# Patient Record
Sex: Female | Born: 1970
Health system: Southern US, Community
[De-identification: ages and names within clinical notes are randomized; demographics above are authoritative.]

## PROBLEM LIST (undated history)

## (undated) DIAGNOSIS — N979 Female infertility, unspecified: Secondary | ICD-10-CM

## (undated) DIAGNOSIS — G479 Sleep disorder, unspecified: Secondary | ICD-10-CM

## (undated) DIAGNOSIS — F419 Anxiety disorder, unspecified: Secondary | ICD-10-CM

## (undated) DIAGNOSIS — G43109 Migraine with aura, not intractable, without status migrainosus: Secondary | ICD-10-CM

## (undated) DIAGNOSIS — E039 Hypothyroidism, unspecified: Secondary | ICD-10-CM

## (undated) DIAGNOSIS — M5430 Sciatica, unspecified side: Secondary | ICD-10-CM

## (undated) DIAGNOSIS — E785 Hyperlipidemia, unspecified: Secondary | ICD-10-CM

## (undated) DIAGNOSIS — F32A Depression, unspecified: Secondary | ICD-10-CM

## (undated) DIAGNOSIS — F329 Major depressive disorder, single episode, unspecified: Secondary | ICD-10-CM

## (undated) DIAGNOSIS — E079 Disorder of thyroid, unspecified: Secondary | ICD-10-CM

## (undated) HISTORY — DX: Disorder of thyroid, unspecified: E07.9

## (undated) HISTORY — DX: Major depressive disorder, single episode, unspecified: F32.9

## (undated) HISTORY — PX: WISDOM TOOTH EXTRACTION: SHX21

## (undated) HISTORY — DX: Female infertility, unspecified: N97.9

## (undated) HISTORY — DX: Anxiety disorder, unspecified: F41.9

## (undated) HISTORY — DX: Depression, unspecified: F32.A

## (undated) HISTORY — DX: Sleep disorder, unspecified: G47.9

## (undated) HISTORY — DX: Sciatica, unspecified side: M54.30

## (undated) HISTORY — DX: Hyperlipidemia, unspecified: E78.5

## (undated) HISTORY — DX: Migraine with aura, not intractable, without status migrainosus: G43.109

---

## 2007-09-12 HISTORY — PX: OTHER SURGICAL HISTORY: SHX169

## 2014-02-23 ENCOUNTER — Telehealth: Payer: Self-pay | Admitting: Obstetrics and Gynecology

## 2014-02-23 NOTE — Telephone Encounter (Signed)
Call to confirm pts appt no answer unable to leave vm  Says it has not been set up yet

## 2014-02-27 NOTE — Telephone Encounter (Signed)
Man answered said wrong #

## 2014-03-01 ENCOUNTER — Encounter: Payer: Self-pay | Admitting: Obstetrics and Gynecology

## 2014-03-01 ENCOUNTER — Ambulatory Visit (INDEPENDENT_AMBULATORY_CARE_PROVIDER_SITE_OTHER): Payer: 59 | Admitting: Obstetrics and Gynecology

## 2014-03-01 VITALS — BP 100/74 | HR 84 | Resp 18 | Ht 64.75 in | Wt 185.6 lb

## 2014-03-01 DIAGNOSIS — Z01419 Encounter for gynecological examination (general) (routine) without abnormal findings: Secondary | ICD-10-CM

## 2014-03-01 DIAGNOSIS — Z0000 Encounter for general adult medical examination without abnormal findings: Secondary | ICD-10-CM

## 2014-03-01 DIAGNOSIS — R3129 Other microscopic hematuria: Secondary | ICD-10-CM

## 2014-03-01 LAB — POCT URINALYSIS DIPSTICK
BILIRUBIN UA: NEGATIVE
Glucose, UA: NEGATIVE
KETONES UA: NEGATIVE
LEUKOCYTES UA: NEGATIVE
Nitrite, UA: NEGATIVE
PH UA: 5
PROTEIN UA: NEGATIVE
Urobilinogen, UA: NEGATIVE

## 2014-03-01 LAB — HEMOGLOBIN, FINGERSTICK: Hemoglobin, fingerstick: 12.7 g/dL (ref 12.0–16.0)

## 2014-03-01 MED ORDER — LEVONORGEST-ETH ESTRAD 91-DAY 0.1-0.02 & 0.01 MG PO TABS: 1.0000 | ORAL_TABLET | Freq: Every day | ORAL | Status: DC

## 2014-03-01 NOTE — Progress Notes (Signed)
Patient scheduled for screening Mammogram at [facility] for 03/14/14 at 1030. Patient agreeable to time/date/location.

## 2014-03-01 NOTE — Patient Instructions (Signed)

## 2014-03-01 NOTE — Progress Notes (Signed)
Patient ID: Rachel Wiggins, female   DOB: [birth year], 43 y.o.   MRN: [id number] GYNECOLOGY VISIT  PCP:  None  Referring provider:   HPI: 43 y.o.   Married  Caucasian  female   G2P2002 with Patient's last menstrual period was 02/13/2014.   here for  AEX.   History of headaches.  Saw a neurologist in [place].  Having menstrual headaches prior to menses.  Has tension headaches.   Has been taking combined OCPs but ran out.  Has not taken for 2 weeks.  Had lower pelvic pain last hs.  Took some pain medication from [place].    Takes birth control for control of menses. Menses are prolonged and heavy.  States it is not for prevention of childbearing, which is against her religion.   Takes Prozac for depression - mild.  Stress under control with this medication.   Did general labs with Dr. Ruben Gottron about 4 months ago.  Need a new PCP.   Hgb:  12.7 Urine:  Trace RBC's - sometimes has urinary discomfort.   History of UTIs 20 years ago.   GYNECOLOGIC HISTORY: Patient's last menstrual period was 02/13/2014. Sexually active:  yes Partner preference: female Contraception:  OCP's-Sprintec (has been out for 22mo.)  Menopausal hormone therapy: n/a DES exposure: no   Blood transfusions:   no Sexually transmitted diseases:  no  GYN procedures and prior surgeries:  no Last mammogram: 7 years ago wnl:in [place]                Last pap and high risk HPV testing: 11/2012 normal--unsure of HPV testing   History of abnormal pap smear:  no   OB History   Grav Para Term Preterm Abortions TAB SAB Ect Mult Living   2 2 2       2        LIFESTYLE: Exercise:   no             OTHER HEALTH MAINTENANCE: Tetanus/TDap:  2010 HPV:                 n/a Influenza:          2013   Bone density:   n/a Colonoscopy:  n/a  Cholesterol check: normal 2-3 years ago.  Family History  Problem Relation Age of Onset  . Diabetes Mother   . Hypertension Mother   . Thyroid disease Mother     hx thyroid nodule   . Hypertension Father   . Hyperlipidemia Father   . Diabetes Maternal Grandmother   . Diabetes Maternal Grandfather   . Diabetes Paternal Grandmother   . Diabetes Paternal Grandfather     There are no active problems to display for this patient.  Past Medical History  Diagnosis Date  . Thyroid disease   . Anxiety   . Depression   . Infertility, female     Past Surgical History  Procedure Laterality Date  . Hemorrhectomy  09/2007    --in [place]    ALLERGIES: Review of patient's allergies indicates no known allergies.  Current Outpatient Prescriptions  Medication Sig Dispense Refill  . FLUoxetine (PROZAC) 20 MG capsule Take 20 mg by mouth daily.      Marland Kitchen levothyroxine (SYNTHROID, LEVOTHROID) 75 MCG tablet Take 75 mcg by mouth daily before breakfast.       No current facility-administered medications for this visit.     ROS:  Pertinent items are noted in HPI.  History   Social History  . Marital Status:  Married    Spouse Name: N/A    Number of Children: N/A  . Years of Education: N/A   Occupational History  . Not on file.   Social History Main Topics  . Smoking status: Never Smoker   . Smokeless tobacco: Not on file  . Alcohol Use: No  . Drug Use: No  . Sexual Activity: Yes    Partners: Male    Birth Control/ Protection: OCP     Comment: Sprintec   Other Topics Concern  . Not on file   Social History Narrative  . No narrative on file    PHYSICAL EXAMINATION:    BP 100/74  Pulse 84  Resp 18  Ht 5' 4.75" (1.645 m)  Wt 185 lb 9.6 oz (84.188 kg)  BMI 31.11 kg/m2  LMP 02/13/2014   Wt Readings from Last 3 Encounters:  03/01/14 185 lb 9.6 oz (84.188 kg)     Ht Readings from Last 3 Encounters:  03/01/14 5' 4.75" (1.645 m)    General appearance: alert, cooperative and appears stated age Head: Normocephalic, without obvious abnormality, atraumatic Neck: no adenopathy, supple, symmetrical, trachea midline and thyroid not enlarged, symmetric, no  tenderness/mass/nodules Lungs: clear to auscultation bilaterally Breasts: Inspection negative, No nipple retraction or dimpling, No nipple discharge or bleeding, No axillary or supraclavicular adenopathy, Normal to palpation without dominant masses Heart: regular rate and rhythm Abdomen: soft, non-tender; no masses,  no organomegaly Extremities: extremities normal, atraumatic, no cyanosis or edema Skin: Skin color, texture, turgor normal. No rashes or lesions Lymph nodes: Cervical, supraclavicular, and axillary nodes normal. No abnormal inguinal nodes palpated Neurologic: Grossly normal  Pelvic: External genitalia:  no lesions              Urethra:  normal appearing urethra with no masses, tenderness or lesions              Bartholins and Skenes: normal                 Vagina: normal appearing vagina with normal color and discharge, no lesions              Cervix: normal appearance.  Small amount of blood tinged mucous at os.               Pap and high risk HPV testing done: Yes.          Bimanual Exam:  Uterus:  uterus is normal size, shape, consistency and tender                                      Adnexa: normal adnexa in size, nontender and no masses                                      Rectovaginal:  Yes.                                        Confirms above.                                      Anus:  normal sphincter tone, no lesions  ASSESSMENT  Normal gynecologic  exam. Menstrual headaches.  History of menorrhagia and ovulatory pain.  I suspect ovulation pain now.  Microscopic hematuria.   PLAN  Mammogram recommended yearly starting at age 32.  [facility] will [facility].  Pap smear and high risk HPV testing as above. Counseled on self breast exam. See lab orders: Yes.   UPT now:  Negative. Rx for Lo-Seasonique.   See EPIC orders.  Instructed in use.  Begin with next menses.  Patient will establish care with new PCP.  Return annually or prn   An After  Visit Summary was printed and given to the patient.

## 2014-03-02 LAB — URINALYSIS, MICROSCOPIC ONLY
BACTERIA UA: NONE SEEN
CASTS: NONE SEEN
CRYSTALS: NONE SEEN

## 2014-03-02 LAB — URINE CULTURE
COLONY COUNT: NO GROWTH
Organism ID, Bacteria: NO GROWTH

## 2014-03-06 LAB — IPS PAP TEST WITH HPV

## 2014-03-14 ENCOUNTER — Other Ambulatory Visit: Payer: Self-pay | Admitting: Obstetrics and Gynecology

## 2014-03-14 ENCOUNTER — Ambulatory Visit: Payer: Self-pay

## 2014-03-14 ENCOUNTER — Ambulatory Visit
Admission: RE | Admit: 2014-03-14 | Discharge: 2014-03-14 | Disposition: A | Discharge status: Home / Self Care | Payer: 59 | Source: Ambulatory Visit | Attending: Obstetrics and Gynecology | Admitting: Obstetrics and Gynecology

## 2014-03-14 DIAGNOSIS — Z1231 Encounter for screening mammogram for malignant neoplasm of breast: Secondary | ICD-10-CM

## 2014-03-16 ENCOUNTER — Telehealth: Payer: Self-pay | Admitting: Obstetrics and Gynecology

## 2014-03-16 ENCOUNTER — Other Ambulatory Visit: Payer: Self-pay | Admitting: Obstetrics and Gynecology

## 2014-03-16 DIAGNOSIS — R928 Other abnormal and inconclusive findings on diagnostic imaging of breast: Secondary | ICD-10-CM

## 2014-03-16 NOTE — Telephone Encounter (Signed)
Tried to reach patient at number provided [phone]. Recording states that call has been forwarded to a voicemail service that has not been set up yet. Unable to leave message.

## 2014-03-16 NOTE — Telephone Encounter (Signed)
Pt says her Rx for birth control that should have been sent over is not at the pharmacy.

## 2014-03-16 NOTE — Telephone Encounter (Signed)
Tried to reach patient at number provided [phone]. There was no answer and the voicemail box has not been set up yet. Need to verify pharmacy patient's rx was sent to.

## 2014-03-17 NOTE — Telephone Encounter (Signed)
Tried to reach patient at number provided [phone]. Recording states that call has been forwarded to a voicemail service that has not been set up yet. Unable to leave a message to verify pharmacy.

## 2014-03-21 NOTE — Telephone Encounter (Signed)
OK to close the encounter.  I will do so now.

## 2014-03-21 NOTE — Telephone Encounter (Signed)
Dr. Quincy Simmonds, unable to reach patient x 3. Okay to close encounter?

## 2014-03-28 ENCOUNTER — Telehealth: Payer: Self-pay | Admitting: Obstetrics and Gynecology

## 2014-03-28 MED ORDER — LEVONORGEST-ETH ESTRAD 91-DAY 0.1-0.02 & 0.01 MG PO TABS: 1.0000 | ORAL_TABLET | Freq: Every day | ORAL | Status: DC

## 2014-03-28 NOTE — Telephone Encounter (Signed)
Pt says she called about medication being sent to pharmacy. Pt's area code was wrong in system so was not able to reach her at that time. Phone number has been corrected.

## 2014-03-28 NOTE — Telephone Encounter (Signed)
Spoke with patient who states that when she went to the pharmacy to pick up her birth control they told her they did not have a prescription for her. Advised patient will resend prescription to pharmacy of choice at this time. Advised to call back if any further problems. Patient agreeable. Patient would like to know when she should start her birth control. Advised to start on the first day of her next cycle. Patient agreeable.  Routing to provider for final review. Patient agreeable to disposition. Will close encounter

## 2014-04-06 ENCOUNTER — Ambulatory Visit
Admission: RE | Admit: 2014-04-06 | Discharge: 2014-04-06 | Disposition: A | Discharge status: Home / Self Care | Payer: 59 | Source: Ambulatory Visit | Attending: Obstetrics and Gynecology | Admitting: Obstetrics and Gynecology

## 2014-04-06 DIAGNOSIS — R928 Other abnormal and inconclusive findings on diagnostic imaging of breast: Secondary | ICD-10-CM

## 2014-05-05 ENCOUNTER — Telehealth: Payer: Self-pay | Admitting: Obstetrics and Gynecology

## 2014-05-05 NOTE — Telephone Encounter (Signed)
Called and spoke with patient.  Advised can call [facility]-Dr. Ardeth Perfect is accepting new patients, Dr. Rachell Cipro and [facility] care. Gave phone numbers for all.  Advised to call back if still needs assistance.   Advised patient will need office visit if would like referrals to rheumatology and orthopedics. Patient states she will call back.   Routing to provider for final review. Patient agreeable to disposition. Will close encounter

## 2014-05-05 NOTE — Telephone Encounter (Signed)
Pt came in to office states dr Quincy Simmonds gave her names of two different PCP offices but neither were accepting new pts. Pt would like to know who else she would recommend. Pt also wants to know who's she would recommend for rheumatologist and orthopaedist. Pt would like referral for this doctors to be able to get in soon.

## 2014-05-15 ENCOUNTER — Encounter: Payer: Self-pay | Admitting: Obstetrics and Gynecology

## 2014-10-24 ENCOUNTER — Telehealth: Payer: Self-pay | Admitting: Obstetrics and Gynecology

## 2014-10-24 NOTE — Telephone Encounter (Signed)
Pt says her pcp changed all her medications. She is not happy about this and would like to speak with Dr Quincy Simmonds. Left a copy of her med sheet for nurse.

## 2014-10-24 NOTE — Telephone Encounter (Signed)
Spoke with patient. She has concerns about changes to medications done by PCP Dr. Ernie Hew.  Patient states that prozac was increased from 20 mg to 40 mg and was causing her headaches. Then after increase of headaches, rx changed to Lexapro 20 mg.  Patient still continues with headaches and this is not out of ordinary for her. Also, patient was changed from [place] to [place]. Patient feels these changes should be discussed with Dr. Quincy Simmonds. Consult appointment scheduled for 10/27/14 with Dr. Quincy Simmonds and patient agreeable. Routing to provider for final review. Patient agreeable to disposition. Will close encounter

## 2014-10-27 ENCOUNTER — Ambulatory Visit (INDEPENDENT_AMBULATORY_CARE_PROVIDER_SITE_OTHER): Payer: 59 | Admitting: Obstetrics and Gynecology

## 2014-10-27 ENCOUNTER — Encounter: Payer: Self-pay | Admitting: Obstetrics and Gynecology

## 2014-10-27 VITALS — BP 110/70 | HR 80 | Ht 64.75 in | Wt 191.2 lb

## 2014-10-27 DIAGNOSIS — Z8742 Personal history of other diseases of the female genital tract: Secondary | ICD-10-CM | POA: Diagnosis not present

## 2014-10-27 DIAGNOSIS — Z3041 Encounter for surveillance of contraceptive pills: Secondary | ICD-10-CM | POA: Diagnosis not present

## 2014-10-27 DIAGNOSIS — R51 Headache: Secondary | ICD-10-CM

## 2014-10-27 DIAGNOSIS — R519 Headache, unspecified: Secondary | ICD-10-CM

## 2014-10-27 MED ORDER — FLUOXETINE HCL 20 MG PO CAPS: 20.0000 mg | ORAL_CAPSULE | Freq: Every day | ORAL | Status: DC

## 2014-10-27 MED ORDER — NORETHINDRONE 0.35 MG PO TABS: 1.0000 | ORAL_TABLET | Freq: Every day | ORAL | Status: DC

## 2014-10-27 MED ORDER — LEVOTHYROXINE SODIUM 75 MCG PO TABS: 75.0000 ug | ORAL_TABLET | Freq: Every day | ORAL | Status: DC

## 2014-10-27 NOTE — Progress Notes (Signed)
Patient ID: Rachel Wiggins, female   DOB: August 22, 1970, 44 y.o.   MRN: [id number] GYNECOLOGY  VISIT   PCP - Dr. Ernie Hew  HPI: 44 y.o.   Married  Caucasian  female   G2P2002 with Patient's last menstrual period was 08/12/2014 (approximate).   here for consultation to discuss medication changes her PCP recommended.   Saw Dr. Ernie Hew for routine visit and needed treatment for headaches.  She recommended stopping LoSeasonique and switching to [place]. She also increased the dosage of her Prozac from 20 - 40 mg daily.   Patient desired to come to discuss her care with me.  She takes the pills to control her heavy menses. She does not desire them for birth control due to her religious beliefs. She understands that they prevent pregnancy.   Having daily headaches but they are not worse with the pills. Does have visual changes.  The increase in he Prozac actually caused an increase in her headaches, so she adjusted herself back down to 20 mg daily.  Patient has a long history of headaches even back in [place].  She has had no formal evaluation for this.    Last pap:  03-01-14 wnl:neg HR HPV Last mammogram and U/S:  03-14-14 dense/benign calcifications in left breast consistent with milk of calcium related to fibrocystic change.  Rec. Screening 46yr/[facility].  GYNECOLOGIC HISTORY: Patient's last menstrual period was 08/12/2014 (approximate). Contraception: OCP--Loseasonique    Menopausal hormone therapy: n/a        OB History    Gravida Para Term Preterm AB TAB SAB Ectopic Multiple Living   2 2 2       2          There are no active problems to display for this patient.   Past Medical History  Diagnosis Date  . Thyroid disease   . Anxiety   . Depression   . Infertility, female     Past Surgical History  Procedure Laterality Date  . Hemorrhectomy  09/2007    --in [place]    Current Outpatient Prescriptions  Medication Sig Dispense Refill  . FLUoxetine (PROZAC) 20 MG capsule  Take 20 mg by mouth daily.    . Levonorgestrel-Ethinyl Estradiol (LOSEASONIQUE) 0.1-0.02 & 0.01 MG tablet Take 1 tablet by mouth daily. 1 Package 3  . levothyroxine (SYNTHROID, LEVOTHROID) 75 MCG tablet Take 75 mcg by mouth daily before breakfast.     No current facility-administered medications for this visit.     ALLERGIES: Review of patient's allergies indicates no known allergies.  Family History  Problem Relation Age of Onset  . Diabetes Mother   . Hypertension Mother   . Thyroid disease Mother     hx thyroid nodule  . Hypertension Father   . Hyperlipidemia Father   . Diabetes Maternal Grandmother   . Diabetes Maternal Grandfather   . Diabetes Paternal Grandmother   . Diabetes Paternal Grandfather     History   Social History  . Marital Status: Married    Spouse Name: N/A  . Number of Children: N/A  . Years of Education: N/A   Occupational History  . Not on file.   Social History Main Topics  . Smoking status: Never Smoker   . Smokeless tobacco: Not on file  . Alcohol Use: No  . Drug Use: No  . Sexual Activity:    Partners: Male    Birth Control/ Protection: OCP     Comment: Sprintec   Other Topics Concern  . Not  on file   Social History Narrative    ROS:  Pertinent items are noted in HPI.  PHYSICAL EXAMINATION:    BP 110/70 mmHg  Pulse 80  Ht 5' 4.75" (1.645 m)  Wt 191 lb 3.2 oz (86.728 kg)  BMI 32.05 kg/m2  LMP 08/12/2014 (Approximate)     General appearance: alert, cooperative and appears stated age  ASSESSMENT  Headaches with visual changes.   Menorrhagia controlled with combined oral contraceptives.  PLAN  Discussion of headaches and oral contraceptives.  I do recommend stopping the LoSeasonique and starting Camilla. 3 packs and 3 refills.  Instructed in use.  [facility]  Follow up for annual exam and prn.  Will get records from PCP.   An After Visit Summary was printed and given to the  patient.  _25_____ minutes face to face time of which over 50% was spent in counseling.

## 2014-12-08 ENCOUNTER — Telehealth: Payer: Self-pay | Admitting: Obstetrics and Gynecology

## 2014-12-08 NOTE — Telephone Encounter (Signed)
Left message on voicemail to call back regarding records release. Will need to fill out another if she want labs faxed to our office from Dr. Murvin Natal office.

## 2015-01-05 ENCOUNTER — Emergency Department (HOSPITAL_COMMUNITY)
Admission: EM | Admit: 2015-01-05 | Discharge: 2015-01-05 | Disposition: A | Discharge status: Home / Self Care | Payer: 59 | Source: Home / Self Care | Attending: Family Medicine | Admitting: Family Medicine

## 2015-01-05 ENCOUNTER — Encounter (HOSPITAL_COMMUNITY): Payer: Self-pay | Admitting: Emergency Medicine

## 2015-01-05 DIAGNOSIS — M5441 Lumbago with sciatica, right side: Secondary | ICD-10-CM

## 2015-01-05 DIAGNOSIS — M5442 Lumbago with sciatica, left side: Secondary | ICD-10-CM

## 2015-01-05 LAB — POCT PREGNANCY, URINE: Preg Test, Ur: NEGATIVE

## 2015-01-05 LAB — POCT URINALYSIS DIP (DEVICE)
Bilirubin Urine: NEGATIVE
Glucose, UA: NEGATIVE mg/dL
Hgb urine dipstick: NEGATIVE
Ketones, ur: NEGATIVE mg/dL
LEUKOCYTES UA: NEGATIVE
NITRITE: NEGATIVE
PH: 7.5 (ref 5.0–8.0)
Protein, ur: NEGATIVE mg/dL
Specific Gravity, Urine: 1.02 (ref 1.005–1.030)
Urobilinogen, UA: 0.2 mg/dL (ref 0.0–1.0)

## 2015-01-05 MED ORDER — CYCLOBENZAPRINE HCL 10 MG PO TABS: 10.0000 mg | ORAL_TABLET | Freq: Every evening | ORAL | Status: DC | PRN

## 2015-01-05 MED ORDER — PREDNISONE 10 MG (48) PO TBPK: ORAL_TABLET | Freq: Every day | ORAL | Status: DC

## 2015-01-05 MED ORDER — TRAMADOL HCL 50 MG PO TABS
50.0000 mg | ORAL_TABLET | Freq: Four times a day (QID) | ORAL | Status: DC | PRN
Start: 2015-01-05 — End: 2015-06-14

## 2015-01-05 NOTE — ED Notes (Signed)
C/o lower back pain onset yest night Reports pain radiates down groin and bilateral legs Pain increases w/activity and when she breathes Denies fevers, chills, abd pain Alert, no signs of acute distress.

## 2015-01-05 NOTE — Discharge Instructions (Signed)
Thank you for coming in today. Take prednisone daily as directed for 12 days. Use Flexeril to relax your muscles especially at bedtime. Use tramadol for severe pain.  Low up with Dr. Alfonso Ramus if not getting better  Come back or go to the emergency room if you notice new weakness new numbness problems walking or bowel or bladder problems.   Citica  (Sciatica)  A citica  uma dor, fraqueza, dormncia ou formigamento no caminho do nervo citico. O nervo comea na parte de [place]. O nervo controla os msculos na parte inferior da perna e na parte de trs do joelho, enquanto tambm fornece a sensao na parte de trs da coxa, parte inferior da perna e na sola dos ps. A citica pode ser um sintoma de [place] condio mdica. Por exemplo, o dano no nervo ou certas condies como um disco herniado ou uma espora ssea na coluna, aperto ou presso no nervo citico. Isto causa a dor, fraqueza ou outras sensaes normalmente associadas com a citica. Geralmente, a citica somente afeta um lado do corpo. CAUSAS   Disco herniado ou deslocado.  Doena degenerativa do disco.  Um distrbio de dor envolvendo o msculo estreito nas ndegas (sndrome do piriforme).  Leso ou fratura plvica.  Carrie Mew.  Tumor (raro). SINTOMAS  Os sintomas podem variar de leves a muito severos. Os sintomas geralmente percorrem da parte de trs das costas s ndegas descendo at a parte de trs [employer]. Os sintomas podem incluir:   Formigamento leve ou dores maantes na parte de trs das costas, pernas ou [place].  Dormncia na parte de trs da panturrilha ou na sola do p.  Sensao de [place] na parte inferior das costas, [place] ou [place].  Dores [employer] na parte inferior das costas, pernas ou [place].  Fraqueza nas pernas.  Dor severa nas costas impedindo o movimento. Esses sintomas podem piorar com a tosse, espirro, risada ou sentar ou ficar em p por tempo  prolongado. Tambm, estar acima do peso pode piorar os sintomas.  DIAGNSTICO  O mdico realizar um exame fsico para observar sintomas comuns da citica. Ele pode solicitar certos movimentos ou atividades que podem disparar a dor do nervo citico. Outros exames podem ser feitos para descobrir a Heritage manager. Esses exames podem incluir:   Exames de sangue.  Raios-X.  Exames por imagem, como uma IRM ou uma varredura TC. TRATAMENTO  O tratamento  direcionado para a causa Licensed conveyancer. Algumas vezes, o tratamento no  necessrio e a dor e o desconforto somem por si prprios. Se o tratamento for necessrio, o mdico pode sugerir:   Medicamentos de venda livre para Public house manager a dor.  Medicamentos sob prescrio, como anti-inflamatrios, relaxantes musculares ou narcticos.  Aplicar calor ou gelo na rea dolorida.  Injees com esteroide para aliviar a dor, irritao e inflamao em torno do nervo.  Reduzir as atividades durantes os perodos de dor.  Se exercitar e esticar para fortalecer seu abdmen e melhorar a flexibilidade de sua coluna. O mdico pode sugerir perda de peso se esse peso extra piora a dor nas costas.  Fisioterapia.  A cirurgia para eliminar o que est pressionando ou beliscando o nervo, como uma espora ssea ou parte de um disco herniado. INSTRUES PARA TRATAMENTO DOMICILIAR   Somente tome medicamentos de venda livre ou prescritos para dor ou desconforto conforme orientado por seu mdico.  Aplique gelo na rea afetada por 20 minutos, de 3  a 4 vezes ao dia durante as primeiras 48 a 72 horas. Em seguida, tente calor da mesma forma.  Se exercite, estique ou realize suas atividades de costume se isso no agravar sua dor.  Faa todas as consultas de fisioterapia, conforme orientado pelo seu mdico.  Faa todas as consultas, conforme orientado pelo seu mdico.  No use saltos altos ou sapatos que no forneam o apoio adequado.  Verifique seu travesseiro para  observar se no  muito macio. Um travesseiro firme pode aliviar sua dor e desconforto. PROCURE UM MDICO IMEDIATAMENTE SE:   Voc perder o controle intestinal ou urinrio (incontinncia).  Tiver Nurse, adult fraqueza na parte inferior das cotas, plvis, ndegas ou pernas.  Apresentar vermelhido ou inchao em suas costas.  Tiver uma sensao de [employer].  Tiver dor que piora quando voc se deita ou acorda  noite.  Sua dor est pior do que a que tinha antes.  A dor dura mais do que 4 semanas.  Voc perder peso repentinamente sem razo. CERTIFIQUE-SE DE:   Compreender estas instrues.  Observar as suas condies.  Procurar um mdico imediatamente se no se sentir bem ou piorar. Document Released: 06/30/2005 Document Revised: 12/30/2011 [facility] Patient Information 2015 [place], [place]. This information is not intended to replace advice given to you by your health care provider. Make sure you discuss any questions you have with your health care provider.   Distenso lombossacral (Lumbosacral Strain) Uma distenso lombossacral  uma distenso de qualquer uma das partes que compe suas vrtebras lombossacrais. As vrtebras lombossacrais so os ossos que compem o tero inferior de sua coluna vertebral. As vrtebras lombossacrais so mantidas unidas por msculos e por um tecido fibroso robusto (ligamentos).  CAUSAS  Uma pancada sbita nas costas pode causar distenso lombossacral. Alm disso, qualquer coisa que cause estiramento [employer] da coluna lombar pode provocar essa distenso. Ela  comumente vista quando as pessoas se exercitam de maneira vigorosa, sofrem quedas, erguem objetos pesados, se inclinam ou se agacham repetidamente. FATORES DE RISCO  Trabalho que demanda esforo fsico.  Prtica de esportes com muitos movimentos de empurrar e puxar, e esportes que exigem tores [employer] (tnis, [place], beisebol).  [place].  Curvatura excessiva da coluna lombar.  Curvatura da pelve para frente.  Msculos fracos nas costas ou no abdome.  Isquiotibiais tensos. SINAIS E SINTOMAS  A distenso lombossacral pode causar dor na rea da leso ou dor que se move (irradia) para baixo na perna.  DIAGNSTICO Em geral, seu mdico poder diagnosticar a distenso lombossacral por meio de um exame fsico. Em alguns casos, voc precisar fazer exames, como radiografias.  TRATAMENTO  O tratamento da leso da coluna lombar depende de muitos fatores que seu mdico ter que avaliar. Mas o tratamento incluir o uso de medicamentos anti-inflamatrios. INSTRUES PARA TRATAMENTO DOMICILIAR   Evite atividades fsicas intensas (tnis, raquetebol, esqui aqutico) caso no tenha preparo fsico apropriado. Isso pode agravar ou criar problemas.  Caso tenha problemas nas costas, evite esportes que exijam movimentos sbitos. Nadar e caminhar geralmente so atividades mais seguras.  Mantenha uma postura correta.  Mantenha um peso saudvel.  Em quadros agudos, voc pode aplicar gelo  rea lesionada.  Coloque gelo em uma sacola plstica.  Coloque uma toalha entre a pele e a sacola.  Deixe o gelo por 20 minutos, de 2 a 3 vezes ao dia.  Quando a Oncologist a Careers information officer, exerccios de alongamento e fortalecimento podem ser recomendados. PROCURE UM MDICO  SE:  A dor na coluna lombar estiver piorando.  Experimentar dor intensa nas costas no aliviada com medicamentos. PROCURE UM MDICO IMEDIATAMENTE SE:   Tiver dormncia, formigamento, fraqueza ou problemas para usar os braos ou as pernas.  Notar uma mudana no controle intestinal ou urinrio.  Sentir dor crescente em qualquer rea do corpo, incluindo a barriga (abdome).  Sentir falta de ar, tontura ou fraqueza.  Tiver enjoo estomacal (nuseas), vomitar (vmitos) ou ficar suado.  Notar Programme researcher, broadcasting/film/video cor dos dedos dos ps ou das pernas ou seus ps ficarem  muito frios. CERTIFIQUE-SE DE:   Compreender estas instrues.  Observar seu estado de sade.  Procurar um mdico imediatamente se no se sentir bem ou piorar. Document Released: 04/09/2005 Document Revised: 07/05/2013 [facility] Patient Information 2015 [place]. This information is not intended to replace advice given to you by your health care provider. Make sure you discuss any questions you have with your health care provider.

## 2015-01-05 NOTE — ED Provider Notes (Signed)
Rachel Wiggins is a 44 y.o. female who presents to Urgent Care today for back pain.  Patient has significant low back pain radiating to the anterior thighs bilaterally. Pain is worse with extension and flexion of the low back. No weakness or numbness bowel bladder dysfunction or difficulty walking. The pain is severe at times. No fevers chills nausea vomiting or abdominal pain. No urinary symptoms. She has not tried any medicines yet. Symptoms present for a day. No injury history.   Past Medical History  Diagnosis Date  . Thyroid disease   . Anxiety   . Depression   . Infertility, female    Past Surgical History  Procedure Laterality Date  . Hemorrhectomy  09/2007    --in [place]   History  Substance Use Topics  . Smoking status: Never Smoker   . Smokeless tobacco: Not on file  . Alcohol Use: No   ROS as above Medications: No current facility-administered medications for this encounter.   Current Outpatient Prescriptions  Medication Sig Dispense Refill  . baclofen (LIORESAL) 10 MG tablet Take 10 mg by mouth 3 (three) times daily.    Marland Kitchen FLUoxetine (PROZAC) 20 MG capsule Take 1 capsule (20 mg total) by mouth daily. 90 capsule 1  . levothyroxine (SYNTHROID, LEVOTHROID) 75 MCG tablet Take 1 tablet (75 mcg total) by mouth daily before breakfast. 90 tablet 1  . zonisamide (ZONEGRAN) 25 MG capsule Take 25 mg by mouth daily.    . cyclobenzaprine (FLEXERIL) 10 MG tablet Take 1 tablet (10 mg total) by mouth at bedtime as needed for muscle spasms. 20 tablet 0  . norethindrone (MICRONOR,CAMILA,ERRIN) 0.35 MG tablet Take 1 tablet (0.35 mg total) by mouth daily. 3 Package 1  . predniSONE (STERAPRED UNI-PAK 48 TAB) 10 MG (48) TBPK tablet Take by mouth daily. 48 tablet 0  . traMADol (ULTRAM) 50 MG tablet Take 1 tablet (50 mg total) by mouth every 6 (six) hours as needed. 15 tablet 0   No Known Allergies   Exam:  BP 121/81 mmHg  Pulse 90  Temp(Src) 99.3 F (37.4 C) (Oral)  Resp 12  SpO2 99%   LMP 12/22/2014 Gen: Well NAD HEENT: EOMI,  MMM Lungs: Normal work of breathing. CTABL Heart: RRR no MRG Abd: NABS, Soft. Nondistended, Nontender Exts: Brisk capillary refill, warm and well perfused.  Back: Nontender to midline. Mildly tender to palpation bilateral SI joints. Low back range of motion shows normal flexion with pain and significantly decreased extension limited by pain. Lower extremity strength is equal and normal bilaterally. She can stand up from a seated position climb on and off exam table squat stand on her toes and heels and has a normal gait. Reflexes are equal and normal bilateral knees and ankles. Sensation is intact throughout. Negative straight leg raise test and Faber test bilaterally.  Results for orders placed or performed during the hospital encounter of 01/05/15 (from the past 24 hour(s))  POCT urinalysis dip (device)     Status: None   Collection Time: 01/05/15  3:07 PM  Result Value Ref Range   Glucose, UA NEGATIVE NEGATIVE mg/dL   Bilirubin Urine NEGATIVE NEGATIVE   Ketones, ur NEGATIVE NEGATIVE mg/dL   Specific Gravity, Urine 1.020 1.005 - 1.030   Hgb urine dipstick NEGATIVE NEGATIVE   pH 7.5 5.0 - 8.0   Protein, ur NEGATIVE NEGATIVE mg/dL   Urobilinogen, UA 0.2 0.0 - 1.0 mg/dL   Nitrite NEGATIVE NEGATIVE   Leukocytes, UA NEGATIVE NEGATIVE  Pregnancy, urine POC  Status: None   Collection Time: 01/05/15  3:11 PM  Result Value Ref Range   Preg Test, Ur NEGATIVE NEGATIVE   No results found.  Assessment and Plan: 44 y.o. female with lumbago likely due to lumbosacral strain. She has pain radiating to the anterior thighs bilaterally. This may be  radicular or may simply be just radiating pain from the low back strain. Plan to treat with prednisone Dosepak Flexeril and tramadol. Plan for follow up with sports medicine if not better.    Discussed warning signs or symptoms. Please see discharge instructions. Patient expresses  understanding.     Gregor Hams, MD 01/05/15 1536

## 2015-02-15 ENCOUNTER — Other Ambulatory Visit: Payer: Self-pay | Admitting: Obstetrics and Gynecology

## 2015-02-15 NOTE — Telephone Encounter (Signed)
Medication refill request: Ortho micronor. Patient is on Micronor  Last AEX:  03/01/14 Dr. Quincy Simmonds  Next AEX: 04/06/15 Dr. Quincy Simmonds Last MMG (if hormonal medication request): 04/06/14 BIRADS2:Benign  Refill authorized: 10/27/14 #3packs/ 1R. Today please advise.

## 2015-04-05 ENCOUNTER — Telehealth: Payer: Self-pay | Admitting: Obstetrics and Gynecology

## 2015-04-05 NOTE — Telephone Encounter (Signed)
Thank you for the update.  Encounter closed. 

## 2015-04-05 NOTE — Telephone Encounter (Signed)
Patient called to reschedule her appointment (AEX) for tomorrow 04/06/15 she started her cycle today and it is heavy. Patient is rescheduled to first available 05/18/2015 with Dr. Quincy Simmonds.  Routed to Dr. Antony Blackbird

## 2015-04-06 ENCOUNTER — Ambulatory Visit: Payer: 59 | Admitting: Obstetrics and Gynecology

## 2015-04-11 ENCOUNTER — Other Ambulatory Visit: Payer: Self-pay

## 2015-04-11 DIAGNOSIS — Z1231 Encounter for screening mammogram for malignant neoplasm of breast: Secondary | ICD-10-CM

## 2015-04-26 ENCOUNTER — Ambulatory Visit
Admission: RE | Admit: 2015-04-26 | Discharge: 2015-04-26 | Disposition: A | Discharge status: Home / Self Care | Payer: 59 | Source: Ambulatory Visit

## 2015-04-26 DIAGNOSIS — Z1231 Encounter for screening mammogram for malignant neoplasm of breast: Secondary | ICD-10-CM

## 2015-04-27 ENCOUNTER — Other Ambulatory Visit: Payer: Self-pay | Admitting: Obstetrics and Gynecology

## 2015-04-27 DIAGNOSIS — R928 Other abnormal and inconclusive findings on diagnostic imaging of breast: Secondary | ICD-10-CM

## 2015-05-10 ENCOUNTER — Ambulatory Visit
Admission: RE | Admit: 2015-05-10 | Discharge: 2015-05-10 | Disposition: A | Discharge status: Home / Self Care | Payer: 59 | Source: Ambulatory Visit | Attending: Obstetrics and Gynecology | Admitting: Obstetrics and Gynecology

## 2015-05-10 DIAGNOSIS — R928 Other abnormal and inconclusive findings on diagnostic imaging of breast: Secondary | ICD-10-CM

## 2015-05-18 ENCOUNTER — Encounter: Payer: Self-pay | Admitting: Obstetrics and Gynecology

## 2015-05-18 ENCOUNTER — Ambulatory Visit (INDEPENDENT_AMBULATORY_CARE_PROVIDER_SITE_OTHER): Payer: 59 | Admitting: Obstetrics and Gynecology

## 2015-05-18 VITALS — BP 122/78 | HR 70 | Resp 18 | Ht 65.0 in | Wt 190.4 lb

## 2015-05-18 DIAGNOSIS — Z01419 Encounter for gynecological examination (general) (routine) without abnormal findings: Secondary | ICD-10-CM | POA: Diagnosis not present

## 2015-05-18 DIAGNOSIS — E038 Other specified hypothyroidism: Secondary | ICD-10-CM

## 2015-05-18 DIAGNOSIS — N921 Excessive and frequent menstruation with irregular cycle: Secondary | ICD-10-CM

## 2015-05-18 DIAGNOSIS — Z0000 Encounter for general adult medical examination without abnormal findings: Secondary | ICD-10-CM | POA: Diagnosis not present

## 2015-05-18 LAB — LIPID PANEL
CHOL/HDL RATIO: 4 ratio (ref <=5.0)
Cholesterol: 183 mg/dL (ref 125–200)
HDL: 46 mg/dL (ref >=46)
LDL Cholesterol: 114 mg/dL (ref <130)
Triglycerides: 114 mg/dL (ref <150)
VLDL: 23 mg/dL (ref <30)

## 2015-05-18 LAB — POCT URINALYSIS DIPSTICK
Bilirubin, UA: NEGATIVE
Glucose, UA: NEGATIVE
KETONES UA: NEGATIVE
Leukocytes, UA: NEGATIVE
Nitrite, UA: NEGATIVE
PH UA: 5
PROTEIN UA: NEGATIVE
Urobilinogen, UA: NEGATIVE

## 2015-05-18 LAB — COMPREHENSIVE METABOLIC PANEL
ALT: 11 U/L (ref 6–29)
AST: 14 U/L (ref 10–30)
Albumin: 4.4 g/dL (ref 3.6–5.1)
Alkaline Phosphatase: 76 U/L (ref 33–115)
BUN: 18 mg/dL (ref 7–25)
CALCIUM: 9.1 mg/dL (ref 8.6–10.2)
CO2: 25 mmol/L (ref 20–31)
Chloride: 100 mmol/L (ref 98–110)
Creat: 0.55 mg/dL (ref 0.50–1.10)
Glucose, Bld: 82 mg/dL (ref 65–99)
Potassium: 4.2 mmol/L (ref 3.5–5.3)
Sodium: 135 mmol/L (ref 135–146)
Total Bilirubin: 0.5 mg/dL (ref 0.2–1.2)
Total Protein: 7.3 g/dL (ref 6.1–8.1)

## 2015-05-18 LAB — CBC
HEMATOCRIT: 40.3 % (ref 36.0–46.0)
Hemoglobin: 13.4 g/dL (ref 12.0–15.0)
MCH: 30.7 pg (ref 26.0–34.0)
MCHC: 33.3 g/dL (ref 30.0–36.0)
MCV: 92.2 fL (ref 78.0–100.0)
MPV: 9.1 fL (ref 8.6–12.4)
Platelets: 237 10*3/uL (ref 150–400)
RBC: 4.37 MIL/uL (ref 3.87–5.11)
RDW: 13.2 % (ref 11.5–15.5)
WBC: 6.6 10*3/uL (ref 4.0–10.5)

## 2015-05-18 LAB — THYROID PANEL WITH TSH
Free Thyroxine Index: 2.1 (ref 1.4–3.8)
T3 Uptake: 31 % (ref 22–35)
T4, Total: 6.9 ug/dL (ref 4.5–12.0)
TSH: 0.133 u[IU]/mL — AB (ref 0.350–4.500)

## 2015-05-18 MED ORDER — NORETHINDRONE 0.35 MG PO TABS: ORAL_TABLET | ORAL | Status: DC

## 2015-05-18 MED ORDER — FLUOXETINE HCL 20 MG PO CAPS: 20.0000 mg | ORAL_CAPSULE | Freq: Every day | ORAL | Status: DC

## 2015-05-18 MED ORDER — LEVOTHYROXINE SODIUM 75 MCG PO TABS: 75.0000 ug | ORAL_TABLET | Freq: Every day | ORAL | Status: DC

## 2015-05-18 NOTE — Patient Instructions (Signed)

## 2015-05-18 NOTE — Progress Notes (Signed)
Patient ID: Rachel Wiggins, female   DOB: Feb 25, 1971, 44 y.o.   MRN: [id number] 44 y.o. G25P2002 Married [place] female here for annual exam.    Menses irregular.  Taking [place].  Menses previously very heavy.   Taking Prozac and Synthroid and needs refills.   Went to the [facility] and [employer].  Stopped caffeine and headaches are now better.  Having sciatica issues and back pain.  Has seen PCP.  May need referral to Ortho.   PCP: None  Patient's last menstrual period was 05/02/2015.          Sexually active: Yes.    The current method of family planning is Ortho Micronor really for menstrual control. Exercising: No.    Smoker:  no  Health Maintenance: Pap:  03-01-14 Neg:Neg HR HPV History of abnormal Pap:  no MMG:  04-26-15 Density Cat.C/Rt.breast asymmetry, Lt.breast neg;Diag.mmg and [place] 14mm mass in Rt.breast at 10 o'clock likely represents a simple cyst/BiRads3/Rec.72mo.US:[facility] Colonoscopy:  n/a BMD:   n/a  Result  nb/a TDaP:  2010 Screening Labs:   Urine today: Trace RBCs (asymptomatic).   reports that she has never smoked. She does not have any smokeless tobacco history on file. She reports that she does not drink alcohol or use illicit drugs.  Past Medical History  Diagnosis Date  . Thyroid disease   . Anxiety   . Depression   . Infertility, female   . Sciatica     Past Surgical History  Procedure Laterality Date  . Hemorrhectomy  09/2007    --in [place]    Current Outpatient Prescriptions  Medication Sig Dispense Refill  . baclofen (LIORESAL) 10 MG tablet Take 10 mg by mouth as needed.     . carisoprodol (SOMA) 350 MG tablet Take 1 tablet by mouth as needed.    Marland Kitchen FLUoxetine (PROZAC) 20 MG capsule Take 1 capsule (20 mg total) by mouth daily. 90 capsule 1  . HYDROcodone-acetaminophen (NORCO/VICODIN) 5-325 MG tablet Take 1 tablet by mouth as needed.    Marland Kitchen levothyroxine (SYNTHROID, LEVOTHROID) 75 MCG tablet Take 1 tablet (75 mcg total) by mouth  daily before breakfast. 90 tablet 1  . ORTHO MICRONOR 0.35 MG tablet TAKE 1 TABLET (0.35 MG TOTAL) BY MOUTH DAILY. 28 tablet 1  . traMADol (ULTRAM) 50 MG tablet Take 1 tablet (50 mg total) by mouth every 6 (six) hours as needed. 15 tablet 0  . zonisamide (ZONEGRAN) 25 MG capsule Take 25 mg by mouth daily.     No current facility-administered medications for this visit.    Family History  Problem Relation Age of Onset  . Diabetes Mother   . Hypertension Mother   . Thyroid disease Mother     hx thyroid nodule  . Hypertension Father   . Hyperlipidemia Father   . Diabetes Maternal Grandmother   . Diabetes Maternal Grandfather   . Diabetes Paternal Grandmother   . Diabetes Paternal Grandfather     ROS:  Pertinent items are noted in HPI.  Otherwise, a comprehensive ROS was negative.  Exam:   BP 122/78 mmHg  Pulse 70  Resp 18  Ht 5\' 5"  (1.651 m)  Wt 190 lb 6.4 oz (86.365 kg)  BMI 31.68 kg/m2  LMP 05/02/2015    General appearance: alert, cooperative and appears stated age Head: Normocephalic, without obvious abnormality, atraumatic Neck: no adenopathy, supple, symmetrical, trachea midline and thyroid normal to inspection and palpation Lungs: clear to auscultation bilaterally Breasts: normal appearance, no masses or tenderness,  Inspection negative, No nipple retraction or dimpling, No nipple discharge or bleeding, No axillary or supraclavicular adenopathy, right nipple slightly darker than left nipple (old change). Heart: regular rate and rhythm Abdomen: soft, non-tender; bowel sounds normal; no masses,  no organomegaly Extremities: extremities normal, atraumatic, no cyanosis or edema Skin: Skin color, texture, turgor normal. No rashes or lesions Lymph nodes: Cervical, supraclavicular, and axillary nodes normal. No abnormal inguinal nodes palpated Neurologic: Grossly normal  Pelvic: External genitalia:  no lesions              Urethra:  normal appearing urethra with no masses,  tenderness or lesions              Bartholins and Skenes: normal                 Vagina: normal appearing vagina with normal color and discharge, no lesions              Cervix: no lesions              Pap taken: No. Bimanual Exam:  Uterus:  normal size, contour, position, consistency, mobility, non-tender              Adnexa: normal adnexa and no mass, fullness, tenderness              Rectovaginal: Yes.  .  Confirms.              Anus:  normal sphincter tone, no lesions  Chaperone was present for exam.  Assessment:   Well woman visit with normal exam. Irregular menses.  Hx menorrhagia.  On Camilla.  Hypothyroidism.  Depression and anxiety.  Sciatica.   Plan: Yearly mammogram recommended after age 80.  Recommended self breast exam.  Pap and HR HPV as above. Information on Calcium, Vitamin D, regular exercise program including cardiovascular and weight bearing exercise. Labs performed.  Yes.  .   See orders.  TFTs, lipids, CMP, CBC. Refills given on medications.  Yes.  .  See orders.  Camilla for one month.  Prozac 20 mg daily for one year.  Synthroid 75 mcg daily for one year.  Return for sonohysterogram and EMB. Follow up with PCP treating sciatica.   Follow up annually and prn.      After visit summary provided.

## 2015-05-21 ENCOUNTER — Telehealth: Payer: Self-pay | Admitting: Obstetrics and Gynecology

## 2015-05-21 NOTE — Telephone Encounter (Signed)
Called patient to review benefits for procedure. Left voicemail to call back and review. °

## 2015-05-23 NOTE — Telephone Encounter (Signed)
Patient returned call. Reviewed benefit for sonohysterogram. Patient understood benefit but has concerns. Patient states she would like to discuss with spouse prior to scheduling. Patient states she would like to call back Friday or Monday to discuss decision. Order pended until 05/28/15 for patient return call.

## 2015-06-01 NOTE — Telephone Encounter (Signed)
Call to patient to check status of scheduling. Left voicemail to return call. During previous call, benefit reviewed with patient understanding. Patient stated she would discuss and return call to schedule the following week.

## 2015-06-04 NOTE — Telephone Encounter (Signed)
Spoke with patient. She agreed to schedule. Patient scheduled 06/14/15 @ 830 with Dr Quincy Simmonds. Patient out with a friend and requested all details regarding scheduling and benefits left on voicemail. Called back and received voicemail. Per patient request reviewed benefit. Reviewed arrival date/time. Reviewed 72 hour cancellation policy with [id number] cancellation fee. Reviewed contact information for return call with any questions. Ok to close.

## 2015-06-14 ENCOUNTER — Other Ambulatory Visit: Payer: Self-pay | Admitting: Obstetrics and Gynecology

## 2015-06-14 ENCOUNTER — Ambulatory Visit (INDEPENDENT_AMBULATORY_CARE_PROVIDER_SITE_OTHER): Payer: 59

## 2015-06-14 ENCOUNTER — Ambulatory Visit (INDEPENDENT_AMBULATORY_CARE_PROVIDER_SITE_OTHER): Payer: 59 | Admitting: Obstetrics and Gynecology

## 2015-06-14 ENCOUNTER — Encounter: Payer: Self-pay | Admitting: Obstetrics and Gynecology

## 2015-06-14 DIAGNOSIS — N921 Excessive and frequent menstruation with irregular cycle: Secondary | ICD-10-CM

## 2015-06-14 NOTE — Patient Instructions (Addendum)
Endometrial Biopsy, Care After Refer to this sheet in the next few weeks. These instructions provide you with information on caring for yourself after your procedure. Your health care provider may also give you more specific instructions. Your treatment has been planned according to current medical practices, but problems sometimes occur. Call your health care provider if you have any problems or questions after your procedure. WHAT TO EXPECT AFTER THE PROCEDURE After your procedure, it is typical to have the following:  You may have mild cramping and a small amount of vaginal bleeding for a few days after the procedure. This is normal. HOME CARE INSTRUCTIONS  Only take over-the-counter or prescription medicine as directed by your health care provider.  Do not douche, use tampons, or have sexual intercourse until your health care provider approves.  Follow your health care provider's instructions regarding any activity restrictions, such as strenuous exercise or heavy lifting. SEEK MEDICAL CARE IF:  You have heavy bleeding or bleeding longer than 2 days after the procedure.  You have bad smelling drainage from your vagina.  You have a fever and chills.  Youhave severe lower stomach (abdominal) pain. SEEK IMMEDIATE MEDICAL CARE IF:  You have severe cramps in your stomach or back.  You pass large blood clots.  Your bleeding increases.  You become weak or lightheaded, or you pass out.   This information is not intended to replace advice given to you by your health care provider. Make sure you discuss any questions you have with your health care provider.   Document Released: 04/20/2013 Document Reviewed: 04/20/2013 Elsevier Interactive Patient Education [employer].  Let me know if you would like proceed with the IUD.

## 2015-06-14 NOTE — Progress Notes (Signed)
Subjective  44 y.o. G2P2002 married Caucasian female here for pelvic ultrasound for menorrhagia with irregular menses. On Camilla.  Having irregular menses.  Menstruates for 4 - 5 days, stops for 3 - 4 days and then recurs without notice and can occur suddenly. Stains clothing.  Took combined oral contraceptives in the past and had headaches.  Dx of migraines with aura.   Hx of recurrent abortion due to Hashimoto's.  Has religious beliefs that guide her decision making for treatment of her bleeding.   Objective  Pelvic ultrasound images and report reviewed with patient.  Uterus - no myometrial masses.   Cesarean scar noted. EMS - 5.18 mm.  Ovaries - normal with follicles.  Free fluid - no     Procedure - sonohysterogram Consent performed. Speculum placed in vagina. Sterile prep of cervix with  Betadine. Cannula placed inside endometrial cavity without difficulty. Speculum removed. Sterile saline injected.       No       filling defect noted. Cannula removed. No complication.   Procedure - endometrial biopsy Consent performed. Speculum place in vagina.  Sterile prep of cervix with betadine.  Tenaculum to anterior cervical lip. Pipelle placed to     8     cm without difficulty twice. Tissue obtained and sent to pathology. Speculum removed.  No complications. Minimal EBL.  Assessment  Menometrorrhagia.  Irregular cycles on Micronor.  Migraines with aura.  Plan  Follow up EMB.  Instructions and precautions given.  Not a candidate for combined oral contraceptives. Extensive discussion regarding options for [name], endometrial ablation and bilateral salpingectomy, and hysterectomy.   The patient declines the latter two options.  Handout on Mirena.   I have explained the procedure and would do a paracervical block.  Patient will consider and contact me back if she wishes to proceed.    ___25____ minutes face to face time of which over 50% was spent in  counseling.   After visit summary to patient.

## 2015-06-18 LAB — IPS OTHER TISSUE BIOPSY

## 2015-06-21 ENCOUNTER — Telehealth: Payer: Self-pay | Admitting: Emergency Medicine

## 2015-06-21 NOTE — Telephone Encounter (Signed)
Message left to return call to Rachel Wiggins at [phone].    

## 2015-06-21 NOTE — Telephone Encounter (Signed)
-----   Message from Nunzio Cobbs, MD sent at 06/19/2015  2:14 PM EST ----- Please report negative EMB to patient.  She was going to consider the [name] for treatment of her abnormal uterine bleeding.  I think she is a good candidate for this if she wishes to proceed.  She is currently on Micronor.  Cc- Marisa Sprinkles

## 2015-07-03 MED ORDER — NORETHINDRONE 0.35 MG PO TABS: ORAL_TABLET | ORAL | Status: DC

## 2015-07-03 NOTE — Telephone Encounter (Signed)
Message left to return call to Rachel Wiggins at [phone].    

## 2015-07-03 NOTE — Telephone Encounter (Signed)
Spoke with patient and message from Dr. Quincy Simmonds given.  She states that she is still considering Mirena but is not sure. Reports that she is doing well on Micronor and wishes to continue at this time. Requests refill today due to out of town travel tomorrow.  Advised will send message to Dr. Quincy Simmonds with refill request.  Patient agreeable.  Dr. Quincy Simmonds, okay to place refill of micronor?

## 2015-07-03 NOTE — Telephone Encounter (Signed)
OK for refills of Micronor until annual exam in 2017. Please send to pharmacy of choice. Thanks.

## 2015-07-03 NOTE — Telephone Encounter (Signed)
Order placed for Micronor to Rachel Wiggins per patient request. Detailed message left, okay per designated party release form.

## 2015-09-13 ENCOUNTER — Telehealth: Payer: Self-pay | Admitting: Obstetrics and Gynecology

## 2015-09-13 DIAGNOSIS — Z30430 Encounter for insertion of intrauterine contraceptive device: Secondary | ICD-10-CM

## 2015-09-13 NOTE — Telephone Encounter (Signed)
Patient has decided on the Mirena. Patient is on the second day of her cycle.

## 2015-09-13 NOTE — Telephone Encounter (Signed)
Spoke with patient. Patient states that she started her cycle on 09/12/2015 and would like to have Mirena inserted. Appointment for IUD insertion scheduled for 09/17/2015 at 10 am with Dr.Silva. She is agreeable to date and time. Pre procedure instructions given.  Motrin instructions given. Motrin=Advil=Ibuprofen, 800 mg one hour before appointment. Eat a meal and hydrate well before appointment. Order placed for precert.  Cc: Lerry Liner  Routing to provider for final review. Patient agreeable to disposition. Will close encounter.

## 2015-09-17 ENCOUNTER — Encounter: Payer: Self-pay | Admitting: Obstetrics and Gynecology

## 2015-09-17 ENCOUNTER — Ambulatory Visit (INDEPENDENT_AMBULATORY_CARE_PROVIDER_SITE_OTHER): Payer: 59 | Admitting: Obstetrics and Gynecology

## 2015-09-17 DIAGNOSIS — Z30430 Encounter for insertion of intrauterine contraceptive device: Secondary | ICD-10-CM

## 2015-09-17 NOTE — Patient Instructions (Signed)

## 2015-09-17 NOTE — Progress Notes (Signed)
Subjective:     Patient ID: Rachel Wiggins, female   DOB: 01-Nov-1970, 45 y.o.   MRN: [id number]   GYNECOLOGY  VISIT  HPI :  45 y.o.   Married [place] female   [phone] with LMP: 09/12/2015.   here for IUD Insertion.  Patient is doing treatment for heavy cycles.  Met with her priest about this and she is now comfortable using the Mirena to treat her heavy menses.  GYNECOLOGIC HISTORY: LMP: 09/12/15 Contraception: Ortho Micronor  Menopausal hormone therapy: None Last mammogram: 05/10/15 BIRADS3: probably Benign  Last pap smear: 03/01/14 Neg. HR HPV:Neg  Review of Systems     Objective:   Physical Exam  [name] insertion - Lot number [id number], exp 06/19 Consent for procedure. Bimanual exam - uterus anteverted and without masses.  No adnexal masses or tenderness. Speculum placed.   Cervix normal.  Paracervical block with 10 cc 1% lidocaine.  Lot number [id number], exp 09/11/16. Uterus sounded to 8 cm.  Mirena placed without difficulty.  Strings trimmed.  No complications.  Bimanual repeated and no change.      Assessment:     [name] insertion.  Menorrhagia.     Plan:     Instructions and precautions given.  Follow up in 5 weeks for IUD check.       After visit summary to patient.

## 2015-10-15 ENCOUNTER — Other Ambulatory Visit: Payer: Self-pay | Admitting: Obstetrics and Gynecology

## 2015-10-15 DIAGNOSIS — N6310 Unspecified lump in the right breast, unspecified quadrant: Secondary | ICD-10-CM

## 2015-10-23 ENCOUNTER — Ambulatory Visit
Admission: RE | Admit: 2015-10-23 | Discharge: 2015-10-23 | Disposition: A | Discharge status: Home / Self Care | Payer: 59 | Source: Ambulatory Visit | Attending: Obstetrics and Gynecology | Admitting: Obstetrics and Gynecology

## 2015-10-23 DIAGNOSIS — N6310 Unspecified lump in the right breast, unspecified quadrant: Secondary | ICD-10-CM

## 2015-10-24 ENCOUNTER — Ambulatory Visit (INDEPENDENT_AMBULATORY_CARE_PROVIDER_SITE_OTHER): Payer: 59 | Admitting: Obstetrics and Gynecology

## 2015-10-24 ENCOUNTER — Encounter: Payer: Self-pay | Admitting: Obstetrics and Gynecology

## 2015-10-24 VITALS — BP 104/60 | HR 70 | Ht 65.0 in | Wt 189.4 lb

## 2015-10-24 DIAGNOSIS — Z30431 Encounter for routine checking of intrauterine contraceptive device: Secondary | ICD-10-CM | POA: Diagnosis not present

## 2015-10-24 NOTE — Progress Notes (Signed)
Patient ID: Rachel Wiggins, female   DOB: [birth year], 45 y.o.   MRN: [id number] GYNECOLOGY  VISIT   HPI: 45 y.o.   Married  Caucasian  female   G2P2002 with Patient's last menstrual period was 10/04/2015.   here for 5 week IUD recheck.  Menses very light and lasted one week.  Had spotting following this.  No pain.  Not sexually active since IUD placed.   Still having headaches at any time.  GYNECOLOGIC HISTORY: Patient's last menstrual period was 10/04/2015. Contraception:  [name]--inserted 09-17-15 Menopausal hormone therapy:  n/a Last mammogram: 04-26-15 Density C/ Rt.br asymmetry,Lt.br.neg;Diag.and [place] 11mm mass in Rt.br. 10 o'clock likely represents a simple cyst/BiRads3/Rec.6 mo f/u US.  [place] 10-23-15 probable benign cyst;recommend Bil.Diag.mammogram and Rt.br. [place] in 88months/BiRads3:[facility] Last pap smear:   03-01-14 Neg:Neg HR HPV        OB History    Gravida Para Term Preterm AB TAB SAB Ectopic Multiple Living   2 2 2       2          There are no active problems to display for this patient.   Past Medical History  Diagnosis Date  . Thyroid disease   . Anxiety   . Depression   . Infertility, female   . Sciatica   . Migraine headache with aura     Past Surgical History  Procedure Laterality Date  . Hemorrhectomy  09/2007    --in [place]    Current Outpatient Prescriptions  Medication Sig Dispense Refill  . FLUoxetine (PROZAC) 20 MG capsule Take 1 capsule (20 mg total) by mouth daily. 90 capsule 3  . ibuprofen (ADVIL) 200 MG tablet Take 200 mg by mouth as needed.    Marland Kitchen levonorgestrel (MIRENA) 20 MCG/24HR IUD 1 each by Intrauterine route once.    Marland Kitchen levothyroxine (SYNTHROID, LEVOTHROID) 75 MCG tablet Take 1 tablet (75 mcg total) by mouth daily before breakfast. 90 tablet 3   No current facility-administered medications for this visit.     ALLERGIES: Peanuts  Family History  Problem Relation Age of Onset  . Diabetes Mother   . Hypertension Mother   .  Thyroid disease Mother     hx thyroid nodule  . Hypertension Father   . Hyperlipidemia Father   . Diabetes Maternal Grandmother   . Diabetes Maternal Grandfather   . Diabetes Paternal Grandmother   . Diabetes Paternal Grandfather     Social History   Social History  . Marital Status: Married    Spouse Name: N/A  . Number of Children: N/A  . Years of Education: N/A   Occupational History  . Not on file.   Social History Main Topics  . Smoking status: Never Smoker   . Smokeless tobacco: Not on file  . Alcohol Use: No  . Drug Use: No  . Sexual Activity:    Partners: Male    Birth Control/ Protection: IUD     Comment: [name] inserted 09-17-15   Other Topics Concern  . Not on file   Social History Narrative    ROS:  Pertinent items are noted in HPI.  PHYSICAL EXAMINATION:    BP 104/60 mmHg  Pulse 70  Ht 5\' 5"  (1.651 m)  Wt 189 lb 6.4 oz (85.911 kg)  BMI 31.52 kg/m2  LMP 10/04/2015    General appearance: alert, cooperative and appears stated age    Pelvic: External genitalia:  no lesions  Urethra:  normal appearing urethra with no masses, tenderness or lesions              Bartholins and Skenes: normal                 Vagina: normal appearing vagina with normal color and discharge, no lesions              Cervix: no lesions and IUD strings seen.           Bimanual Exam:  Uterus:  normal size, contour, position, consistency, mobility, non-tender              Adnexa: normal adnexa and no mass, fullness, tenderness            Chaperone was present for exam.  ASSESSMENT  [name] check up.  Doing well.  PLAN  [name] discussed.  Can continue until menopause.  Follow up for annual exam and prn.  Call if strings are causing pain with intercourse.    An After Visit Summary was printed and given to the patient.  _15_____ minutes face to face time of which over 50% was spent in counseling.

## 2016-02-22 IMAGING — MG MM SCREEN MAMMOGRAM BILATERAL
4 series · 4 of 4 positions shown · non-contrast
Comparison: None

CLINICAL DATA: Screening.

EXAM:
DIGITAL SCREENING BILATERAL MAMMOGRAM WITH CAD

[L CC]
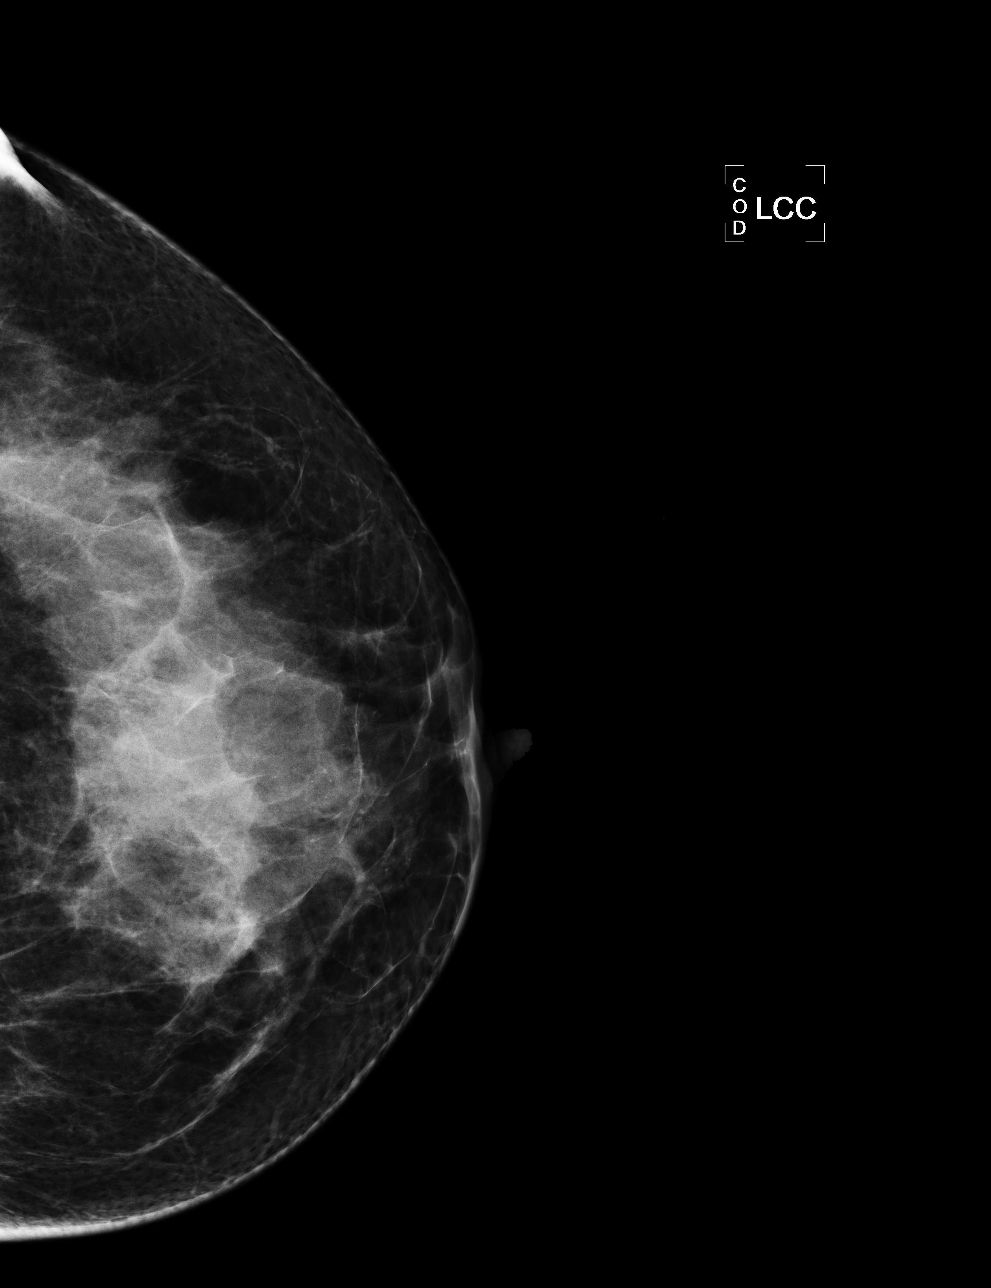

[L MLO (1 of 2)]
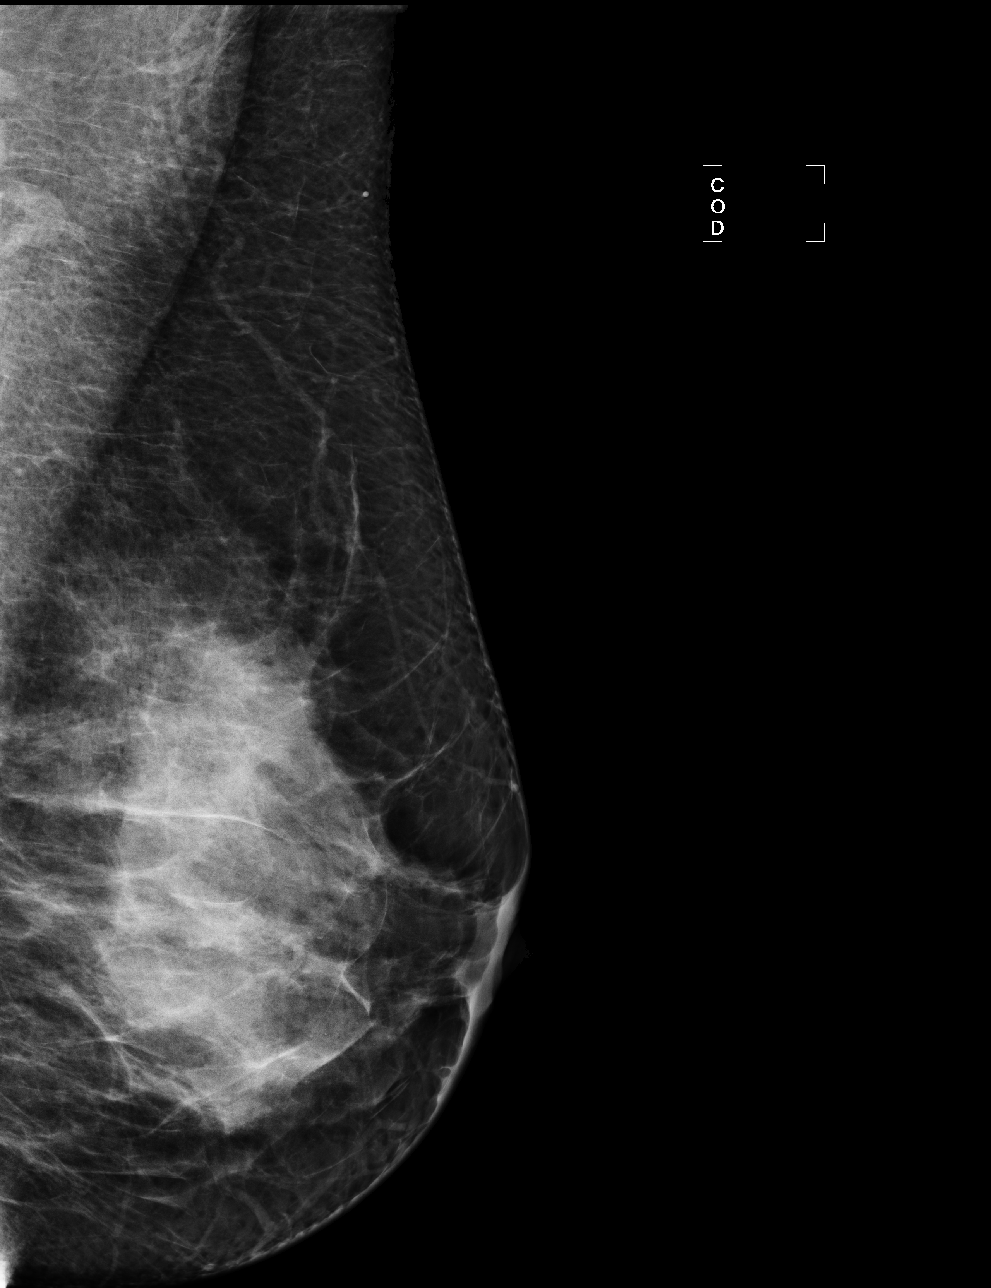

[R MLO]
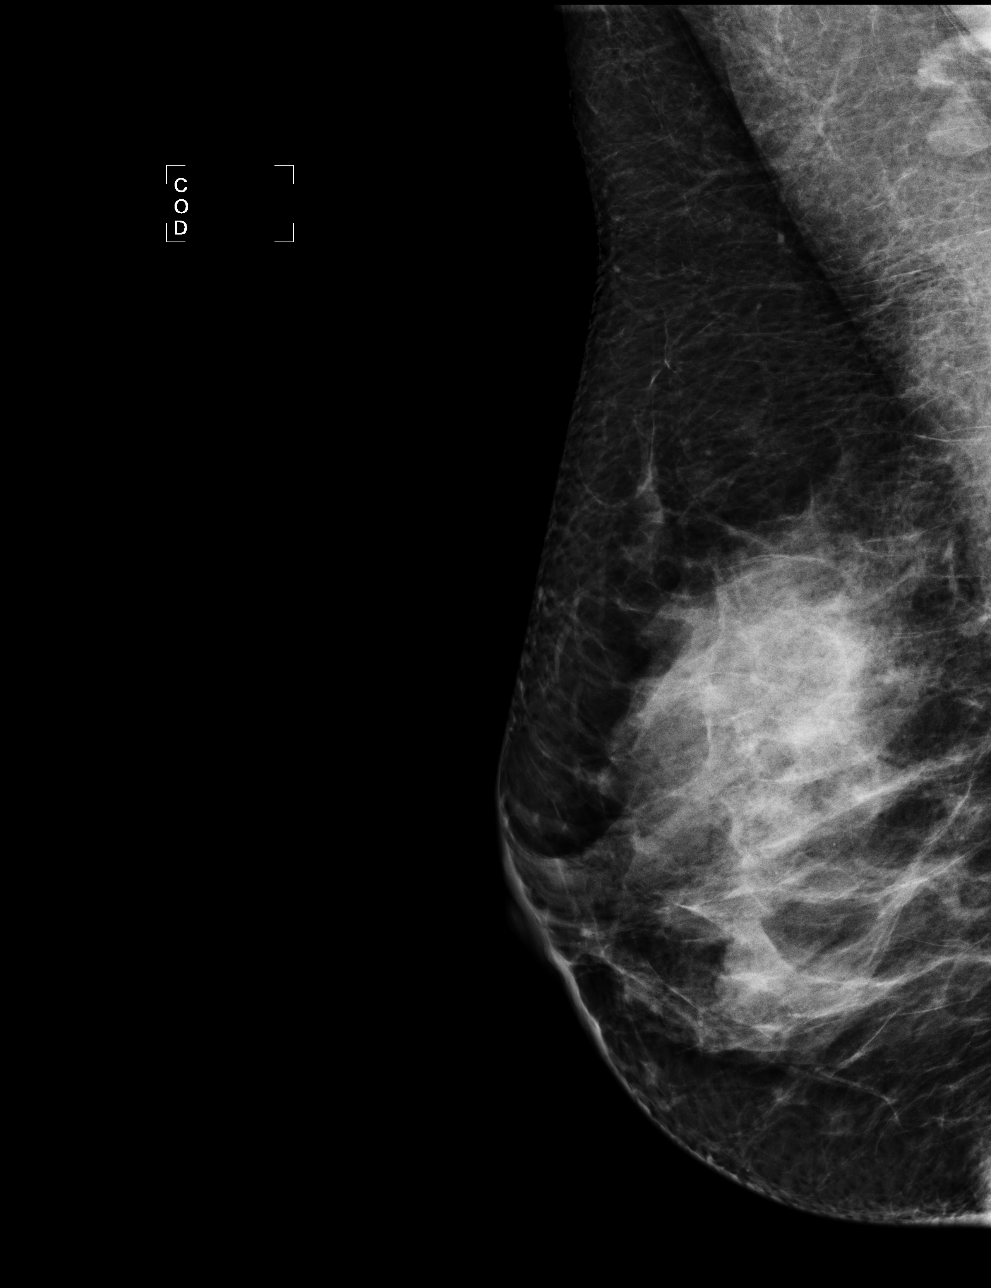

[L MLO (2 of 2)]
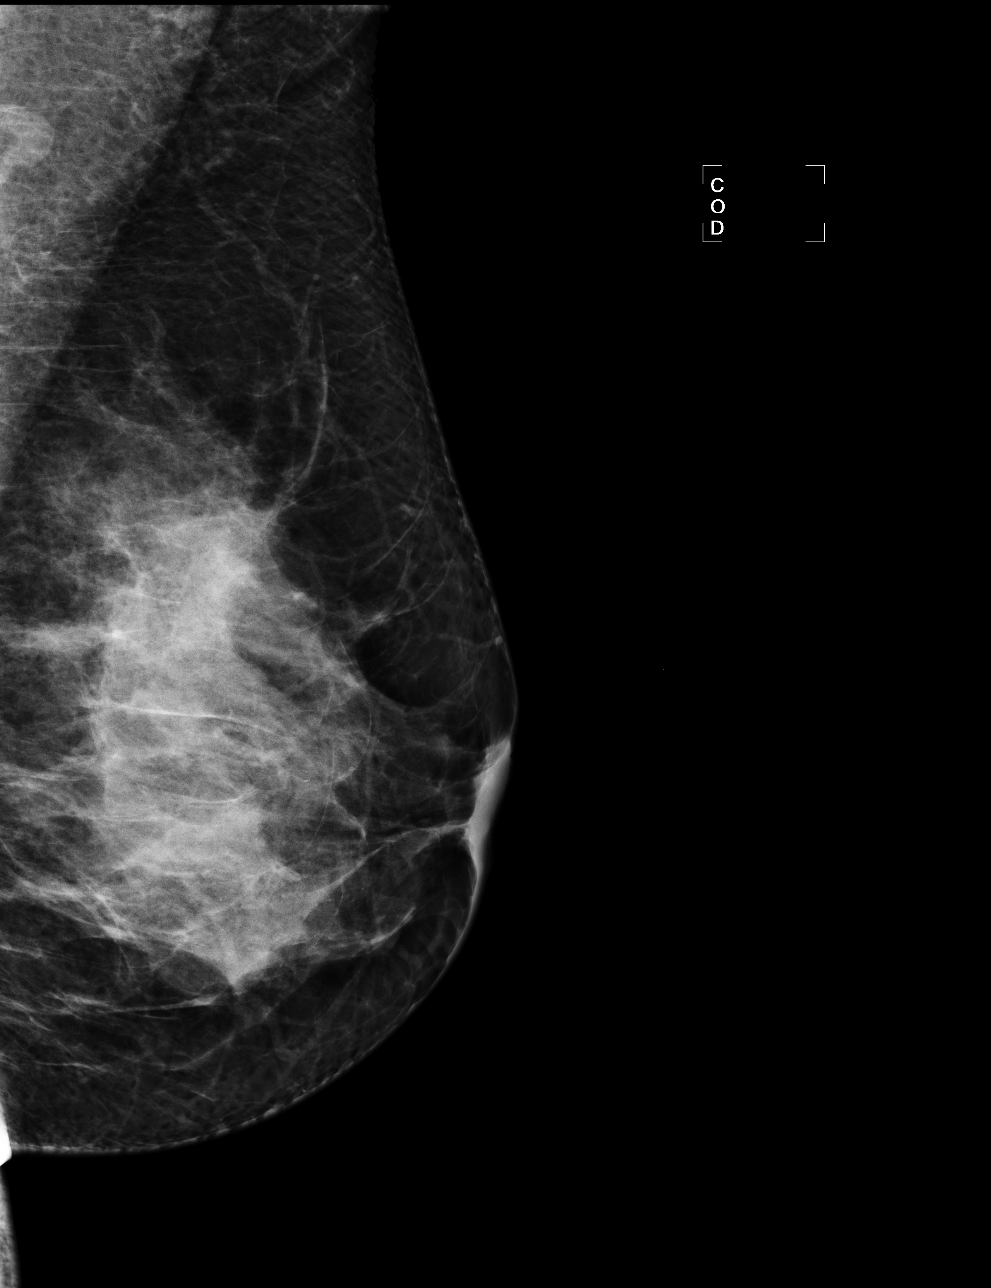

[4 of 4 positions shown; findings below may reference images not displayed]

ACR Breast Density Category c: The breast tissue is heterogeneously
dense, which may obscure small masses.
FINDINGS: In the left breast, calcifications warrant further evaluation with
magnified views. In the right breast, no findings suspicious for
malignancy. Images were processed with CAD.
IMPRESSION: Further evaluation is suggested for calcifications in the left
breast.

RECOMMENDATION:
Diagnostic mammogram of the left breast. (Code:0H-[id number])

The patient will be contacted regarding the findings, and additional
imaging will be scheduled.

BI-RADS CATEGORY  0: Incomplete. Need additional imaging evaluation
and/or prior mammograms for comparison.

## 2016-04-23 ENCOUNTER — Other Ambulatory Visit: Payer: Self-pay | Admitting: Obstetrics and Gynecology

## 2016-04-23 NOTE — Telephone Encounter (Signed)
Medication refill request: Prozac and Levothyroxine  Last AEX:  05-18-15 Next AEX: 05-30-16 Last MMG (if hormonal medication request): 10-23-15 U/S- recommend  Bilateral DX MMG/Right Breast U/S  Refill authorized: Please advise

## 2016-05-08 ENCOUNTER — Telehealth: Payer: Self-pay | Admitting: Obstetrics and Gynecology

## 2016-05-08 NOTE — Telephone Encounter (Signed)
Left message on voicemail to call and reschedule cancelled appointment. °

## 2016-05-20 ENCOUNTER — Telehealth: Payer: Self-pay | Admitting: *Deleted

## 2016-05-20 ENCOUNTER — Other Ambulatory Visit: Payer: Self-pay | Admitting: Obstetrics and Gynecology

## 2016-05-20 DIAGNOSIS — N6001 Solitary cyst of right breast: Secondary | ICD-10-CM

## 2016-05-20 NOTE — Telephone Encounter (Signed)
Patient in 04 recall for 04/2016. She needs Bilateral DX MMG/ Right U/S. Please contact patient regarding this  thanks

## 2016-05-22 ENCOUNTER — Ambulatory Visit
Admission: RE | Admit: 2016-05-22 | Discharge: 2016-05-22 | Disposition: A | Discharge status: Home / Self Care | Payer: 59 | Source: Ambulatory Visit | Attending: Obstetrics and Gynecology | Admitting: Obstetrics and Gynecology

## 2016-05-22 DIAGNOSIS — N6001 Solitary cyst of right breast: Secondary | ICD-10-CM

## 2016-05-22 NOTE — Telephone Encounter (Signed)
Spoke with patient. Patient was seen at the [facility] today for bilateral diagnostic mammogram with right breast ultrasound. Results are available in EPIC.   Dr.Silva, Okay to remove from current recall and place patient in 88 month recall?

## 2016-05-23 NOTE — Telephone Encounter (Signed)
Yes, OK to remove from current recall and place in new recall for November 2018.

## 2016-05-23 NOTE — Telephone Encounter (Signed)
Patient removed from current recall. New recall placed for November 2018.  Cc: Starlyn Skeans, CMA  Routing to provider for final review. Patient agreeable to disposition. Will close encounter.

## 2016-05-30 ENCOUNTER — Ambulatory Visit: Payer: 59 | Admitting: Obstetrics and Gynecology

## 2016-06-19 NOTE — Progress Notes (Signed)
45 y.o. [id number] Married Caucasian female here for annual exam.    Has Mirena for cycle control.  Has some bleeding every 6 weeks.   Has pressure between anus and the vagina.  Does not feel the necessity to have a bowel movement.   Headaches are only slighty better with Mirena.  Migraines are better overall. Has one every 15 days instead of 3 per week. Takes only Advil.   Takes Prozac and Synthroid.  Doing well with Prozac.   Seeing chiropractor for sciatica.  Did acupuncture first.   Bought a house.  Became citizens.   PCP: No PCP.  Considering PCP of her husband.   No LMP recorded.           Sexually active: Yes.    The current method of family planning is IUD--[name] inserted 09-17-15.    Exercising: No.  The patient does not participate in regular exercise at present. Smoker:  no  Health Maintenance: Pap:  03-01-14 Neg:Neg HR HPV History of abnormal Pap:  no MMG:  05-22-16 Diag.Bil.and Rt.Br.U/S;Density C/Neg/stable probably benign mass in Rt.Br.consistent with a cyst/BiRads3/Bil.Diag.w/poss.Rt.Br.U/S 12 months:TBC Colonoscopy:  n/a BMD:   n/a  Result  n/a TDaP:  2010 Gardasil:   N/A HIV: years ago she was checked- NEG Hep C: NA Screening Labs: Labs drawn today Hb today: 13.3, Urine today:    reports that she has never smoked. She has never used smokeless tobacco. She reports that she does not drink alcohol or use drugs.  Past Medical History:  Diagnosis Date  . Anxiety   . Depression   . Infertility, female   . Migraine headache with aura   . Sciatica   . Thyroid disease     Past Surgical History:  Procedure Laterality Date  . hemorrhectomy  09/2007   --in [place]    Current Outpatient Prescriptions  Medication Sig Dispense Refill  . FLUoxetine (PROZAC) 20 MG capsule TAKE 1 CAPSULE (20 MG TOTAL) BY MOUTH DAILY. 90 capsule 0  . ibuprofen (ADVIL) 200 MG tablet Take 200 mg by mouth as needed.    Marland Kitchen levonorgestrel (MIRENA) 20 MCG/24HR IUD 1 each by  Intrauterine route once.    Marland Kitchen levothyroxine (SYNTHROID, LEVOTHROID) 75 MCG tablet TAKE 1 TABLET (75 MCG TOTAL) BY MOUTH DAILY BEFORE BREAKFAST. 90 tablet 0   No current facility-administered medications for this visit.     Family History  Problem Relation Age of Onset  . Diabetes Mother   . Hypertension Mother   . Thyroid disease Mother     hx thyroid nodule  . Hypertension Father   . Hyperlipidemia Father   . Diabetes Maternal Grandmother   . Diabetes Maternal Grandfather   . Diabetes Paternal Grandmother   . Diabetes Paternal Grandfather     ROS:  Pertinent items are noted in HPI.  Otherwise, a comprehensive ROS was negative.  Exam:   BP 102/70 (BP Location: Right Arm, Patient Position: Sitting, Cuff Size: Normal)   Pulse 88   Resp 14   Ht 5' 4.5" (1.638 m)   Wt 191 lb (86.6 kg)   BMI 32.28 kg/m     General appearance: alert, cooperative and appears stated age Head: Normocephalic, without obvious abnormality, atraumatic Neck: no adenopathy, supple, symmetrical, trachea midline and thyroid normal to inspection and palpation Lungs: clear to auscultation bilaterally Breasts: normal appearance, no masses or tenderness, No nipple retraction or dimpling, No nipple discharge or bleeding, No axillary or supraclavicular adenopathy Heart: regular rate and rhythm Abdomen:  soft, non-tender; no masses, no organomegaly Extremities: extremities normal, atraumatic, no cyanosis or edema Skin: Skin color, texture, turgor normal. No rashes or lesions Lymph nodes: Cervical, supraclavicular, and axillary nodes normal. No abnormal inguinal nodes palpated Neurologic: Grossly normal  Pelvic: External genitalia:  no lesions              Urethra:  normal appearing urethra with no masses, tenderness or lesions              Bartholins and Skenes: normal                 Vagina: normal appearing vagina with normal color and discharge, no lesions              Cervix: no lesions.  IUD strings seen.                Pap taken: No. Bimanual Exam:  Uterus:  normal size, contour, position, consistency, mobility, non-tender              Adnexa: no mass, fullness, tenderness              Rectal exam: Yes.  .  Confirms.              Anus:  normal sphincter tone, no lesions  Chaperone was present for exam.  Assessment:   Well woman visit with normal exam. [name] for control of cycles. Migraine headaches improved overall. Hypothyroidism.  Depression and anxiety.  Perineal discomfort.   Normal exam.  Probable relationship to bowel/rectal function.   Plan: Yearly mammogram recommended after age 74.  Recommended self breast exam.  Pap and HR HPV as above. Discussed Calcium, Vitamin D, regular exercise program including cardiovascular and weight bearing exercise. Continue Synthroid and Prozac.  Routine labs.  Follow up annually and prn.       After visit summary provided.

## 2016-06-23 ENCOUNTER — Other Ambulatory Visit: Payer: 59

## 2016-06-23 ENCOUNTER — Ambulatory Visit (INDEPENDENT_AMBULATORY_CARE_PROVIDER_SITE_OTHER): Payer: 59 | Admitting: Obstetrics and Gynecology

## 2016-06-23 ENCOUNTER — Encounter: Payer: Self-pay | Admitting: Obstetrics and Gynecology

## 2016-06-23 VITALS — BP 102/70 | HR 88 | Resp 14 | Ht 64.5 in | Wt 191.0 lb

## 2016-06-23 DIAGNOSIS — Z0000 Encounter for general adult medical examination without abnormal findings: Secondary | ICD-10-CM | POA: Diagnosis not present

## 2016-06-23 DIAGNOSIS — Z01419 Encounter for gynecological examination (general) (routine) without abnormal findings: Secondary | ICD-10-CM | POA: Diagnosis not present

## 2016-06-23 LAB — CBC
HCT: 40.7 % (ref 35.0–45.0)
Hemoglobin: 13.3 g/dL (ref 11.7–15.5)
MCH: 30.9 pg (ref 27.0–33.0)
MCHC: 32.7 g/dL (ref 32.0–36.0)
MCV: 94.7 fL (ref 80.0–100.0)
MPV: 9.4 fL (ref 7.5–12.5)
PLATELETS: 232 10*3/uL (ref 140–400)
RBC: 4.3 MIL/uL (ref 3.80–5.10)
RDW: 13 % (ref 11.0–15.0)
WBC: 5.8 10*3/uL (ref 3.8–10.8)

## 2016-06-23 LAB — HEMOGLOBIN, FINGERSTICK: Hemoglobin, fingerstick: 13.3 g/dL (ref 12.0–15.0)

## 2016-06-23 MED ORDER — LEVOTHYROXINE SODIUM 75 MCG PO TABS: 75.0000 ug | ORAL_TABLET | Freq: Every day | ORAL | 3 refills | Status: DC

## 2016-06-23 MED ORDER — FLUOXETINE HCL 20 MG PO CAPS: 20.0000 mg | ORAL_CAPSULE | Freq: Every day | ORAL | 3 refills | Status: DC

## 2016-06-23 NOTE — Patient Instructions (Signed)

## 2016-06-24 LAB — LIPID PANEL
CHOLESTEROL: 167 mg/dL (ref <200)
HDL: 46 mg/dL — ABNORMAL LOW (ref >50)
LDL Cholesterol: 108 mg/dL — ABNORMAL HIGH (ref <100)
Total CHOL/HDL Ratio: 3.6 Ratio (ref <5.0)
Triglycerides: 65 mg/dL (ref <150)
VLDL: 13 mg/dL (ref <30)

## 2016-06-24 LAB — COMPREHENSIVE METABOLIC PANEL
ALK PHOS: 68 U/L (ref 33–115)
ALT: 9 U/L (ref 6–29)
AST: 15 U/L (ref 10–30)
Albumin: 4.4 g/dL (ref 3.6–5.1)
BILIRUBIN TOTAL: 0.7 mg/dL (ref 0.2–1.2)
BUN: 14 mg/dL (ref 7–25)
CO2: 24 mmol/L (ref 20–31)
CREATININE: 0.62 mg/dL (ref 0.50–1.10)
Calcium: 9.1 mg/dL (ref 8.6–10.2)
Chloride: 103 mmol/L (ref 98–110)
Glucose, Bld: 80 mg/dL (ref 65–99)
Potassium: 3.5 mmol/L (ref 3.5–5.3)
SODIUM: 139 mmol/L (ref 135–146)
TOTAL PROTEIN: 7.2 g/dL (ref 6.1–8.1)

## 2016-06-24 LAB — THYROID PANEL WITH TSH
FREE THYROXINE INDEX: 2.4 (ref 1.4–3.8)
T3 Uptake: 32 % (ref 22–35)
T4, Total: 7.4 ug/dL (ref 4.5–12.0)
TSH: 0.21 m[IU]/L — AB

## 2016-06-24 LAB — VITAMIN D 25 HYDROXY (VIT D DEFICIENCY, FRACTURES): Vit D, 25-Hydroxy: 28 ng/mL — ABNORMAL LOW (ref 30–100)

## 2016-06-26 ENCOUNTER — Telehealth: Payer: Self-pay

## 2016-06-26 NOTE — Telephone Encounter (Signed)
-----   Message from Nunzio Cobbs, MD sent at 06/26/2016  4:42 AM EST ----- Please report results of labs to patient.   Vit D level is slightly low at 28.  Our goal is 30 - 50.  I am recommending over the counter Vit D 1000 IU daily.  Her level can be tested again next year.   Her thyroid shows slightly low TSH but normal thyroid hormone levels.  I am recommending keeping her Synthroid dosage the same.  This will be retested next year.   The cholesterol screening showed some minor alterations.  The HDL was slightly low and the LDL was slightly elevated.  She can reverse these and lower risk of cardiovascular disease overall by increasing vigorous exercise and lowering cholesterol in her diet.   Blood counts and blood chemistries were normal.   Cc- Marisa Sprinkles

## 2016-06-26 NOTE — Telephone Encounter (Signed)
Left message to call Kaitlyn at [phone]. 

## 2016-06-30 NOTE — Telephone Encounter (Signed)
Spoke with patient. Advised of results and message from [place] as seen below. Patient is agreeable and verbalizes understanding.   Routing to provider for final review. Patient agreeable to disposition. Will close encounter.

## 2016-07-16 ENCOUNTER — Encounter (HOSPITAL_COMMUNITY): Payer: Self-pay | Admitting: Emergency Medicine

## 2016-07-16 ENCOUNTER — Ambulatory Visit (HOSPITAL_COMMUNITY)
Admission: EM | Admit: 2016-07-16 | Discharge: 2016-07-16 | Disposition: A | Discharge status: Home / Self Care | Payer: 59 | Attending: Emergency Medicine | Admitting: Emergency Medicine

## 2016-07-16 DIAGNOSIS — M5431 Sciatica, right side: Secondary | ICD-10-CM | POA: Diagnosis not present

## 2016-07-16 MED ORDER — HYDROCODONE-ACETAMINOPHEN 5-325 MG PO TABS: 1.0000 | ORAL_TABLET | ORAL | 0 refills | Status: DC | PRN

## 2016-07-16 MED ORDER — PREDNISONE 10 MG (21) PO TBPK: 10.0000 mg | ORAL_TABLET | Freq: Every day | ORAL | 0 refills | Status: DC

## 2016-07-16 NOTE — ED Provider Notes (Signed)
CSN: [id number]     Arrival date & time 07/16/16  1646 History   None    Chief Complaint  Patient presents with  . Leg Pain   (Consider location/radiation/quality/duration/timing/severity/associated sxs/prior Treatment) Patient c/o right back pain radiating down right buttock and right leg to right knee.   The history is provided by the patient.  Leg Pain  Location:  Buttock Time since incident:  2 months Injury: no   Buttock location:  R buttock Pain details:    Quality:  Aching   Radiates to:  R leg   Severity:  Moderate   Onset quality:  Gradual   Duration:  2 months   Timing:  Constant   Progression:  Worsening Chronicity:  New Dislocation: no   Prior injury to area:  No Relieved by:  Nothing Worsened by:  Nothing Ineffective treatments:  None tried Associated symptoms: back pain and numbness     Past Medical History:  Diagnosis Date  . Anxiety   . Depression   . Infertility, female   . Migraine headache with aura   . Sciatica   . Thyroid disease    Past Surgical History:  Procedure Laterality Date  . hemorrhectomy  09/2007   --in [place]   Family History  Problem Relation Age of Onset  . Diabetes Mother   . Hypertension Mother   . Thyroid disease Mother     hx thyroid nodule  . Hypertension Father   . Hyperlipidemia Father   . Diabetes Maternal Grandmother   . Diabetes Maternal Grandfather   . Diabetes Paternal Grandmother   . Diabetes Paternal Grandfather    Social History  Substance Use Topics  . Smoking status: Never Smoker  . Smokeless tobacco: Never Used  . Alcohol use No   OB History    Gravida Para Term Preterm AB Living   2 2 2     2    SAB TAB Ectopic Multiple Live Births                 Review of Systems  Constitutional: Negative.   HENT: Negative.   Eyes: Negative.   Respiratory: Negative.   Cardiovascular: Negative.   Gastrointestinal: Negative.   Endocrine: Negative.   Genitourinary: Negative.   Musculoskeletal: Positive  for back pain.  Allergic/Immunologic: Negative.   Hematological: Negative.   Psychiatric/Behavioral: Negative.     Allergies  Peanuts [peanut oil]  Home Medications   Prior to Admission medications   Medication Sig Start Date End Date Taking? Authorizing Provider  FLUoxetine (PROZAC) 20 MG capsule Take 1 capsule (20 mg total) by mouth daily. 06/23/16  Yes Brook E Yisroel Ramming, MD  levonorgestrel (MIRENA) 20 MCG/24HR IUD 1 each by Intrauterine route once.   Yes Historical Provider, MD  levothyroxine (SYNTHROID, LEVOTHROID) 75 MCG tablet Take 1 tablet (75 mcg total) by mouth daily before breakfast. 06/23/16  Yes Brook E Yisroel Ramming, MD  meloxicam (MOBIC) 15 MG tablet Take 15 mg by mouth daily.   Yes Historical Provider, MD  HYDROcodone-acetaminophen (NORCO/VICODIN) 5-325 MG tablet Take 1-2 tablets by mouth every 4 (four) hours as needed. 07/16/16   Lysbeth Penner, FNP  predniSONE (STERAPRED UNI-PAK 21 TAB) 10 MG (21) TBPK tablet Take 1 tablet (10 mg total) by mouth daily. Take 6 tabs by mouth daily  for 2 days, then 5 tabs for 2 days, then 4 tabs for 2 days, then 3 tabs for 2 days, 2 tabs for 2 days, then 1 tab  by mouth daily for 2 days 07/16/16   Lysbeth Penner, FNP   Meds Ordered and Administered this Visit  Medications - No data to display  BP 133/78 (BP Location: Right Arm)   Pulse 90   Temp 98.4 F (36.9 C) (Oral)   Resp 18   SpO2 100%  No data found.   Physical Exam  Constitutional: She appears well-developed and well-nourished.  HENT:  Head: Normocephalic and atraumatic.  Eyes: EOM are normal. Pupils are equal, round, and reactive to light.  Neck: Normal range of motion. Neck supple.  Cardiovascular: Normal rate, regular rhythm and normal heart sounds.   Pulmonary/Chest: Effort normal and breath sounds normal.  Musculoskeletal: She exhibits tenderness.  Tenderness right sciatic notch  Nursing note and vitals reviewed.   Urgent Care Course   Clinical Course      Procedures (including critical care time)  Labs Review Labs Reviewed - No data to display  Imaging Review No results found.   Visual Acuity Review  Right Eye Distance:   Left Eye Distance:   Bilateral Distance:    Right Eye Near:   Left Eye Near:    Bilateral Near:         MDM   1. Sciatica of right side    Sterapred dose pack as directed #42 Norco 5/325 one to two po q 6 hours prn #12     Lysbeth Penner, FNP 07/16/16 1812

## 2016-07-16 NOTE — ED Triage Notes (Signed)
The patient presented to the [facility] with a complaint of right leg pain x 2 months that she believed to be sciatic nerve pain. The patient stated that she was given meloxicam by another [facility] 2 weeks ago and it did not relieve the pain.

## 2016-08-14 ENCOUNTER — Other Ambulatory Visit: Payer: Self-pay | Admitting: *Deleted

## 2016-08-14 ENCOUNTER — Encounter: Payer: Self-pay | Admitting: Sports Medicine

## 2016-08-14 ENCOUNTER — Ambulatory Visit (INDEPENDENT_AMBULATORY_CARE_PROVIDER_SITE_OTHER): Payer: 59 | Admitting: Sports Medicine

## 2016-08-14 VITALS — BP 122/82 | HR 81 | Ht 65.0 in | Wt 190.0 lb

## 2016-08-14 DIAGNOSIS — M5126 Other intervertebral disc displacement, lumbar region: Secondary | ICD-10-CM

## 2016-08-14 MED ORDER — GABAPENTIN 300 MG PO CAPS: 300.0000 mg | ORAL_CAPSULE | Freq: Two times a day (BID) | ORAL | 1 refills | Status: DC

## 2016-08-14 NOTE — Progress Notes (Signed)
  Rachel Wiggins - 46 y.o. female MRN [id number]  Date of birth: 01-22-1971  SUBJECTIVE:   CC: R Leg Pain  HPI: States that for the last 2-3 months she has been having right-sided leg pain. She localizes pain to the back lateral side of her leg. Does not endorse any radiating pain. Pain is sharp. Denies any back pain. Patient states that pain is worse when sitting and then standing. Patient has failed several therapies including acupuncture, chiropractic, by mouth steroids burst, and injection. These therapies have not reduced the pain. She rates the pain is 7-8 constantly. With worsening pain a 10-11 in severity when she stands. Patient denies any back surgeries. Denies any injury or trauma to the back or leg. Denies any loss of sensation or numbness tingling to leg. States the leg just feels heavy. Denies any incontinence symptoms.   ROS per HPI.    HISTORY: Past Medical, Surgical, Social, and Family History Reviewed & Updated per EMR.   Pertinent Historical Findings include: Hypothyroidism   DATA REVIEWED: LR Lumbar spine AP and lateral from 08/04/16 - No acute focal bony abnormality. Prominent amount of stool in the colon.  PHYSICAL EXAM:  BP 122/82   Pulse 81   Ht 5\' 5"  (1.651 m)   Wt 190 lb (86.2 kg)   BMI 31.62 kg/m   General: alert, well-developed, mild distress, cooperative Msk: no joint swelling, no joint warmth, and no redness over joints. Normal tone.  Pulses: DP/PT are full and equal bilaterally.  Extremities: No cyanosis, clubbing, edema. Neurologic: No focal deficits, +5 strength globally, sensation grossly intact, gait antalgic, A&Ox3. Deep tendon reflexes: 2+ Achilles reflexes, left patellar reflex 3+ but right patellar reflex 2+.  Skin: Intact without suspicious lesions or rashes. Warm and dry. Psych: Mood and affect are normal; no evidence of anxiety or depression.  Hip: ROM normal. Pain with hip flexion. Greater trochanter without tenderness to palpation. No pain  with FABER or FADIR. No SI joint tenderness.  Back Exam:  Inspection: Unremarkable. ROM normal; does have increased pain with forward flexion. SLR laying: Positive. XSLR laying: Positive. Palpable tenderness: None.  ASSESSMENT & PLAN:   A: R. Leg Pain. Concern for lumbar disc herniation/pinched nerve based off history and exam. No red flags however due to the worsening pain and severity of pain want patient to be evaluated urgently.   P:   Urgent referral placed for patient to go to back surgeon  Rx given for patient to use gabapentin 300 mg twice a day  Will hold off on any imaging at this time as patient will likely receive when she goes to back surgeon   Luiz Blare, DO 08/14/2016, 9:06 AM PGY-3, [facility]  Patient seen and evaluated with the resident. I agree with the above plan of care. Although it is not a textbook case, I think this patient is experiencing pain from a lumbar disc protrusion/herniation. Her history and physical exam findings would suggest that. Given her current level of pain, I placed a referral to Dr Lynann Bologna and increased her gabapentin to 300 mg twice daily. I'm going to defer any additional imaging such as MRI scans to the discretion of Dr.Dumonski. Patient will follow-up with me as needed.

## 2016-08-21 ENCOUNTER — Encounter: Payer: Self-pay | Admitting: Sports Medicine

## 2016-09-23 ENCOUNTER — Other Ambulatory Visit: Payer: Self-pay | Admitting: Orthopedic Surgery

## 2016-09-29 ENCOUNTER — Encounter (HOSPITAL_COMMUNITY): Payer: Self-pay

## 2016-09-29 NOTE — Pre-Procedure Instructions (Signed)
Rachel Wiggins  09/29/2016      [facility] [address], [place] [address] Phone: [phone] Fax: [phone]    Your procedure is scheduled on March 21  Report to [place] at [phone]   Call this number if you have problems the morning of surgery:  [phone]   Remember:  Do not eat food or drink liquids after midnight.  Take these medicines the morning of surgery with A SIP OF WATER Fluoxetine (Prozac), Levothyroxine (Synthroid), Oxycodone (Percocet) if needed  Stop taking aspirin, BC's, Goody's, Herbal medications, Fish Oil, Ibuprofen, Advil, Motrin, Aleve   Do not wear jewelry, make-up or nail polish.  Do not wear lotions, powders, or perfumes, or deoderant.  Do not shave 48 hours prior to surgery.  Men may shave face and neck.  Do not bring valuables to the hospital.  [facility] is not responsible for any belongings or valuables.  Contacts, dentures or bridgework may not be worn into surgery.  Leave your suitcase in the car.  After surgery it may be brought to your room.  For patients admitted to the hospital, discharge time will be determined by your treatment team.  Patients discharged the day of surgery will not be allowed to drive home.     Special instructions:  [facility] - Preparing for Surgery  Before surgery, you can play an important role.  Because skin is not sterile, your skin needs to be as free of germs as possible.  You can reduce the number of germs on you skin by washing with CHG (chlorahexidine gluconate) soap before surgery.  CHG is an antiseptic cleaner which kills germs and bonds with the skin to continue killing germs even after washing.  Please DO NOT use if you have an allergy to CHG or antibacterial soaps.  If your skin becomes reddened/irritated stop using the CHG and inform your nurse when you arrive at Short Stay.  Do not shave (including  legs and underarms) for at least 48 hours prior to the first CHG shower.  You may shave your face.  Please follow these instructions carefully:   1.  Shower with CHG Soap the night before surgery and the   morning of Surgery.  2.  If you choose to wash your hair, wash your hair first as usual with your    normal shampoo.  3.  After you shampoo, rinse your hair and body thoroughly to remove the Shampoo.  4.  Use CHG as you would any other liquid soap.  You can apply chg directly   to the skin and wash gently with scrungie or a clean washcloth.  5.  Apply the CHG Soap to your body ONLY FROM THE NECK DOWN.    Do not use on open wounds or open sores.  Avoid contact with your eyes,   ears, mouth and genitals (private parts).  Wash genitals (private parts)   with your normal soap.  6.  Wash thoroughly, paying special attention to the area where your surgery  will be performed.  7.  Thoroughly rinse your body with warm water from the neck down.  8.  DO NOT shower/wash with your normal soap after using and rinsing off the CHG Soap.  9.  Pat yourself dry with a clean towel.            10.  Wear clean pajamas.  11.  Place clean sheets on your bed the night of your first shower and do not sleep with pets.  Day of Surgery  Do not apply any lotions/deoderants the morning of surgery.  Please wear clean clothes to the hospital/surgery center.     Please read over the following fact sheets that you were given. Pain Booklet, Coughing and Deep Breathing, MRSA Information and Surgical Site Infection Prevention

## 2016-09-30 ENCOUNTER — Encounter (HOSPITAL_COMMUNITY): Payer: Self-pay

## 2016-09-30 ENCOUNTER — Encounter (HOSPITAL_COMMUNITY)
Admission: RE | Admit: 2016-09-30 | Discharge: 2016-09-30 | Disposition: A | Discharge status: Home / Self Care | Payer: 59 | Source: Ambulatory Visit | Attending: Orthopedic Surgery | Admitting: Orthopedic Surgery

## 2016-09-30 DIAGNOSIS — Z79899 Other long term (current) drug therapy: Secondary | ICD-10-CM | POA: Diagnosis not present

## 2016-09-30 DIAGNOSIS — Z01818 Encounter for other preprocedural examination: Secondary | ICD-10-CM

## 2016-09-30 DIAGNOSIS — F419 Anxiety disorder, unspecified: Secondary | ICD-10-CM | POA: Diagnosis not present

## 2016-09-30 DIAGNOSIS — M5117 Intervertebral disc disorders with radiculopathy, lumbosacral region: Secondary | ICD-10-CM | POA: Diagnosis present

## 2016-09-30 DIAGNOSIS — F329 Major depressive disorder, single episode, unspecified: Secondary | ICD-10-CM | POA: Diagnosis not present

## 2016-09-30 HISTORY — DX: Hypothyroidism, unspecified: E03.9

## 2016-09-30 LAB — URINALYSIS, ROUTINE W REFLEX MICROSCOPIC
Bilirubin Urine: NEGATIVE
GLUCOSE, UA: NEGATIVE mg/dL
KETONES UR: NEGATIVE mg/dL
Leukocytes, UA: NEGATIVE
Nitrite: NEGATIVE
PROTEIN: NEGATIVE mg/dL
Specific Gravity, Urine: 1.029 (ref 1.005–1.030)
pH: 5 (ref 5.0–8.0)

## 2016-09-30 LAB — PROTIME-INR
INR: 1.03
Prothrombin Time: 13.5 seconds (ref 11.4–15.2)

## 2016-09-30 LAB — CBC WITH DIFFERENTIAL/PLATELET
BASOS ABS: 0 10*3/uL (ref 0.0–0.1)
BASOS PCT: 0 %
EOS ABS: 0.1 10*3/uL (ref 0.0–0.7)
Eosinophils Relative: 1 %
HEMATOCRIT: 40.5 % (ref 36.0–46.0)
HEMOGLOBIN: 13.6 g/dL (ref 12.0–15.0)
Lymphocytes Relative: 21 %
Lymphs Abs: 1.6 10*3/uL (ref 0.7–4.0)
MCH: 31.1 pg (ref 26.0–34.0)
MCHC: 33.6 g/dL (ref 30.0–36.0)
MCV: 92.7 fL (ref 78.0–100.0)
Monocytes Absolute: 0.4 10*3/uL (ref 0.1–1.0)
Monocytes Relative: 6 %
NEUTROS ABS: 5.5 10*3/uL (ref 1.7–7.7)
NEUTROS PCT: 72 %
Platelets: 219 10*3/uL (ref 150–400)
RBC: 4.37 MIL/uL (ref 3.87–5.11)
RDW: 12.7 % (ref 11.5–15.5)
WBC: 7.6 10*3/uL (ref 4.0–10.5)

## 2016-09-30 LAB — SURGICAL PCR SCREEN
MRSA, PCR: NEGATIVE
STAPHYLOCOCCUS AUREUS: NEGATIVE

## 2016-09-30 LAB — COMPREHENSIVE METABOLIC PANEL
ALBUMIN: 4.3 g/dL (ref 3.5–5.0)
ALK PHOS: 54 U/L (ref 38–126)
ALT: 13 U/L — AB (ref 14–54)
ANION GAP: 10 (ref 5–15)
AST: 19 U/L (ref 15–41)
BUN: 18 mg/dL (ref 6–20)
CALCIUM: 9.4 mg/dL (ref 8.9–10.3)
CO2: 24 mmol/L (ref 22–32)
CREATININE: 0.58 mg/dL (ref 0.44–1.00)
Chloride: 102 mmol/L (ref 101–111)
GFR calc Af Amer: >60 mL/min (ref >60)
GFR calc non Af Amer: >60 mL/min (ref >60)
GLUCOSE: 116 mg/dL — AB (ref 65–99)
Potassium: 3.6 mmol/L (ref 3.5–5.1)
SODIUM: 136 mmol/L (ref 135–145)
Total Bilirubin: 0.8 mg/dL (ref 0.3–1.2)
Total Protein: 7.5 g/dL (ref 6.5–8.1)

## 2016-09-30 LAB — HCG, SERUM, QUALITATIVE: PREG SERUM: NEGATIVE

## 2016-09-30 LAB — APTT: aPTT: 32 seconds (ref 24–36)

## 2016-09-30 NOTE — H&P (Signed)
PREOPERATIVE H&P  Chief Complaint: R leg pain  HPI: Rachel Wiggins is a 46 y.o. female who presents with ongoing pain in the right leg x 4 months  MRI reveals a R-sided L5/S1 HNP  Patient has failed multiple forms of conservative care and continues to have pain (see office notes for additional details regarding the patient's full course of treatment)  Past Medical History:  Diagnosis Date  . Anxiety   . Depression   . Infertility, female   . Migraine headache with aura   . Sciatica   . Thyroid disease    Past Surgical History:  Procedure Laterality Date  . hemorrhectomy  09/2007   --in [place]   Social History   Social History  . Marital status: Married    Spouse name: N/A  . Number of children: N/A  . Years of education: N/A   Social History Main Topics  . Smoking status: Never Smoker  . Smokeless tobacco: Never Used  . Alcohol use No  . Drug use: No  . Sexual activity: Yes    Partners: Male    Birth control/ protection: IUD     Comment: [name] inserted 09-17-15   Other Topics Concern  . Not on file   Social History Narrative  . No narrative on file   Family History  Problem Relation Age of Onset  . Diabetes Mother   . Hypertension Mother   . Thyroid disease Mother     hx thyroid nodule  . Hypertension Father   . Hyperlipidemia Father   . Diabetes Maternal Grandmother   . Diabetes Maternal Grandfather   . Diabetes Paternal Grandmother   . Diabetes Paternal Grandfather    Allergies  Allergen Reactions  . Peanuts [Peanut Oil] Shortness Of Breath and Swelling    When eaten in excess   Prior to Admission medications   Medication Sig Start Date End Date Taking? Authorizing Provider  cholecalciferol (VITAMIN D) 1000 units tablet Take 1,000 Units by mouth daily.   Yes Historical Provider, MD  FLUoxetine (PROZAC) 20 MG capsule Take 1 capsule (20 mg total) by mouth daily. 06/23/16  Yes Brook E Yisroel Ramming, MD  levonorgestrel (MIRENA) 20  MCG/24HR IUD 1 each by Intrauterine route once.   Yes Historical Provider, MD  levothyroxine (SYNTHROID, LEVOTHROID) 75 MCG tablet Take 1 tablet (75 mcg total) by mouth daily before breakfast. 06/23/16  Yes Brook E Yisroel Ramming, MD  oxyCODONE-acetaminophen (PERCOCET/ROXICET) 5-325 MG tablet Take 1 tablet by mouth every 6 (six) hours.  09/23/16  Yes Historical Provider, MD  gabapentin (NEURONTIN) 300 MG capsule Take 1 capsule (300 mg total) by mouth 2 (two) times daily. Patient not taking: Reported on 09/25/2016 08/14/16   Thurman Coyer, DO  HYDROcodone-acetaminophen (NORCO/VICODIN) 5-325 MG tablet Take 1-2 tablets by mouth every 4 (four) hours as needed. Patient not taking: Reported on 09/25/2016 07/16/16   Lysbeth Penner, FNP     All other systems have been reviewed and were otherwise negative with the exception of those mentioned in the HPI and as above.  Physical Exam: There were no vitals filed for this visit.  General: Alert, no acute distress Cardiovascular: No pedal edema Respiratory: No cyanosis, no use of accessory musculature Skin: No lesions in the area of chief complaint Neurologic: Sensation intact distally Psychiatric: Patient is competent for consent with normal mood and affect Lymphatic: No axillary or cervical lymphadenopathy  MUSCULOSKELETAL: + SLR on the right  Assessment/Plan: Right leg  pain Plan for Procedure(s): RIGHT SIDED LUMBAR 5-SACRUM 1 MICRODISCECTOMY   Sinclair Ship, MD 09/30/2016 8:12 AM

## 2016-09-30 NOTE — Progress Notes (Signed)
PCp is Daylene Posey, FNP Denies ever seeing a cardiologist. Denies ever having a card cath, stress test, or echo. Denies any chest pain.

## 2016-10-01 ENCOUNTER — Ambulatory Visit (HOSPITAL_COMMUNITY): Payer: 59

## 2016-10-01 ENCOUNTER — Encounter (HOSPITAL_COMMUNITY): Payer: Self-pay

## 2016-10-01 ENCOUNTER — Ambulatory Visit (HOSPITAL_COMMUNITY): Payer: 59 | Admitting: Anesthesiology

## 2016-10-01 ENCOUNTER — Ambulatory Visit (HOSPITAL_COMMUNITY)
Admission: RE | Admit: 2016-10-01 | Discharge: 2016-10-01 | Disposition: A | Discharge status: Home / Self Care | Payer: 59 | Source: Ambulatory Visit | Attending: Orthopedic Surgery | Admitting: Orthopedic Surgery

## 2016-10-01 ENCOUNTER — Encounter (HOSPITAL_COMMUNITY)
Admission: RE | Disposition: A | Discharge status: Home / Self Care | Payer: Self-pay | Source: Ambulatory Visit | Attending: Orthopedic Surgery

## 2016-10-01 DIAGNOSIS — M5117 Intervertebral disc disorders with radiculopathy, lumbosacral region: Secondary | ICD-10-CM | POA: Insufficient documentation

## 2016-10-01 DIAGNOSIS — M5410 Radiculopathy, site unspecified: Secondary | ICD-10-CM

## 2016-10-01 DIAGNOSIS — F329 Major depressive disorder, single episode, unspecified: Secondary | ICD-10-CM | POA: Insufficient documentation

## 2016-10-01 DIAGNOSIS — Z79899 Other long term (current) drug therapy: Secondary | ICD-10-CM | POA: Insufficient documentation

## 2016-10-01 DIAGNOSIS — F419 Anxiety disorder, unspecified: Secondary | ICD-10-CM | POA: Insufficient documentation

## 2016-10-01 HISTORY — PX: LUMBAR LAMINECTOMY: SHX95

## 2016-10-01 SURGERY — MICRODISCECTOMY LUMBAR LAMINECTOMY
Anesthesia: General | Laterality: Right

## 2016-10-01 MED ORDER — THROMBIN 20000 UNITS EX SOLR
CUTANEOUS | Status: AC
  Filled 2016-10-01: qty 20000

## 2016-10-01 MED ORDER — DIAZEPAM 5 MG PO TABS
5.0000 mg | ORAL_TABLET | Freq: Once | ORAL | Status: AC
  Administered 2016-10-01: 5 mg via ORAL

## 2016-10-01 MED ORDER — METHYLPREDNISOLONE ACETATE 40 MG/ML IJ SUSP
INTRAMUSCULAR | Status: DC | PRN
  Administered 2016-10-01: 40 mg

## 2016-10-01 MED ORDER — CEFAZOLIN SODIUM 1 G IJ SOLR
INTRAMUSCULAR | Status: DC | PRN
  Administered 2016-10-01: 2 g via INTRAMUSCULAR

## 2016-10-01 MED ORDER — PROPOFOL 10 MG/ML IV BOLUS
INTRAVENOUS | Status: AC
  Filled 2016-10-01: qty 20

## 2016-10-01 MED ORDER — BUPIVACAINE LIPOSOME 1.3 % IJ SUSP
20.0000 mL | Freq: Once | INTRAMUSCULAR | Status: DC
  Filled 2016-10-01: qty 20

## 2016-10-01 MED ORDER — THROMBIN 20000 UNITS EX SOLR
CUTANEOUS | Status: DC | PRN
  Administered 2016-10-01: 18:00:00 via TOPICAL

## 2016-10-01 MED ORDER — SUCCINYLCHOLINE CHLORIDE 200 MG/10ML IV SOSY
PREFILLED_SYRINGE | INTRAVENOUS | Status: AC
  Filled 2016-10-01: qty 10

## 2016-10-01 MED ORDER — LIDOCAINE 2% (20 MG/ML) 5 ML SYRINGE
INTRAMUSCULAR | Status: AC
  Filled 2016-10-01: qty 5

## 2016-10-01 MED ORDER — OXYCODONE HCL 5 MG PO TABS
ORAL_TABLET | ORAL | Status: AC
  Filled 2016-10-01: qty 1

## 2016-10-01 MED ORDER — EPINEPHRINE PF 1 MG/ML IJ SOLN
INTRAMUSCULAR | Status: AC
  Filled 2016-10-01: qty 1

## 2016-10-01 MED ORDER — HYDROMORPHONE HCL 1 MG/ML IJ SOLN
0.2500 mg | INTRAMUSCULAR | Status: DC | PRN
  Administered 2016-10-01: 0.5 mg via INTRAVENOUS
  Administered 2016-10-01 (×2): 0.25 mg via INTRAVENOUS

## 2016-10-01 MED ORDER — HYDROMORPHONE HCL 1 MG/ML IJ SOLN
INTRAMUSCULAR | Status: AC
  Filled 2016-10-01: qty 0.5

## 2016-10-01 MED ORDER — ROCURONIUM BROMIDE 50 MG/5ML IV SOSY
PREFILLED_SYRINGE | INTRAVENOUS | Status: AC
  Filled 2016-10-01: qty 5

## 2016-10-01 MED ORDER — POVIDONE-IODINE 7.5 % EX SOLN: Freq: Once | CUTANEOUS | Status: DC

## 2016-10-01 MED ORDER — BUPIVACAINE HCL (PF) 0.25 % IJ SOLN
INTRAMUSCULAR | Status: AC
  Filled 2016-10-01: qty 30

## 2016-10-01 MED ORDER — FENTANYL CITRATE (PF) 100 MCG/2ML IJ SOLN
INTRAMUSCULAR | Status: AC
  Administered 2016-10-01: 50 ug via INTRAVENOUS
  Filled 2016-10-01: qty 2

## 2016-10-01 MED ORDER — BUPIVACAINE LIPOSOME 1.3 % IJ SUSP
INTRAMUSCULAR | Status: DC | PRN
  Administered 2016-10-01: 20 mL

## 2016-10-01 MED ORDER — THROMBIN 5000 UNITS EX SOLR
CUTANEOUS | Status: DC | PRN
  Administered 2016-10-01: 5000 [IU] via TOPICAL

## 2016-10-01 MED ORDER — METHYLENE BLUE 0.5 % INJ SOLN
INTRAVENOUS | Status: AC
  Filled 2016-10-01: qty 10

## 2016-10-01 MED ORDER — OXYCODONE HCL 5 MG PO TABS
5.0000 mg | ORAL_TABLET | Freq: Once | ORAL | Status: AC | PRN
  Administered 2016-10-01: 5 mg via ORAL

## 2016-10-01 MED ORDER — MIDAZOLAM HCL 5 MG/5ML IJ SOLN
INTRAMUSCULAR | Status: DC | PRN
  Administered 2016-10-01: 2 mg via INTRAVENOUS

## 2016-10-01 MED ORDER — FENTANYL CITRATE (PF) 100 MCG/2ML IJ SOLN
INTRAMUSCULAR | Status: DC | PRN
  Administered 2016-10-01 (×3): 50 ug via INTRAVENOUS
  Administered 2016-10-01: 100 ug via INTRAVENOUS
  Administered 2016-10-01: 50 ug via INTRAVENOUS

## 2016-10-01 MED ORDER — DEXAMETHASONE SODIUM PHOSPHATE 4 MG/ML IJ SOLN
INTRAMUSCULAR | Status: DC | PRN
  Administered 2016-10-01: 8 mg via INTRAVENOUS

## 2016-10-01 MED ORDER — ROCURONIUM BROMIDE 100 MG/10ML IV SOLN
INTRAVENOUS | Status: DC | PRN
  Administered 2016-10-01: 10 mg via INTRAVENOUS
  Administered 2016-10-01: 50 mg via INTRAVENOUS

## 2016-10-01 MED ORDER — EPHEDRINE 5 MG/ML INJ
INTRAVENOUS | Status: AC
  Filled 2016-10-01: qty 10

## 2016-10-01 MED ORDER — BUPIVACAINE-EPINEPHRINE 0.25% -1:200000 IJ SOLN
INTRAMUSCULAR | Status: DC | PRN
  Administered 2016-10-01: 20 mL
  Administered 2016-10-01: 8 mL

## 2016-10-01 MED ORDER — MIDAZOLAM HCL 2 MG/2ML IJ SOLN
INTRAMUSCULAR | Status: AC
  Filled 2016-10-01: qty 2

## 2016-10-01 MED ORDER — FENTANYL CITRATE (PF) 100 MCG/2ML IJ SOLN
INTRAMUSCULAR | Status: AC
  Filled 2016-10-01: qty 2

## 2016-10-01 MED ORDER — LIDOCAINE HCL (CARDIAC) 20 MG/ML IV SOLN
INTRAVENOUS | Status: DC | PRN
  Administered 2016-10-01: 60 mg via INTRAVENOUS

## 2016-10-01 MED ORDER — LACTATED RINGERS IV SOLN
INTRAVENOUS | Status: DC | PRN
  Administered 2016-10-01 (×2): via INTRAVENOUS

## 2016-10-01 MED ORDER — OXYCODONE HCL 5 MG/5ML PO SOLN: 5.0000 mg | Freq: Once | ORAL | Status: AC | PRN

## 2016-10-01 MED ORDER — SUGAMMADEX SODIUM 200 MG/2ML IV SOLN
INTRAVENOUS | Status: DC | PRN
  Administered 2016-10-01: 200 mg via INTRAVENOUS

## 2016-10-01 MED ORDER — FENTANYL CITRATE (PF) 100 MCG/2ML IJ SOLN
INTRAMUSCULAR | Status: AC
  Filled 2016-10-01: qty 4

## 2016-10-01 MED ORDER — CEFAZOLIN SODIUM-DEXTROSE 2-4 GM/100ML-% IV SOLN: 2.0000 g | INTRAVENOUS | Status: DC

## 2016-10-01 MED ORDER — PHENYLEPHRINE 40 MCG/ML (10ML) SYRINGE FOR IV PUSH (FOR BLOOD PRESSURE SUPPORT)
PREFILLED_SYRINGE | INTRAVENOUS | Status: AC
  Filled 2016-10-01: qty 10

## 2016-10-01 MED ORDER — NEOSTIGMINE METHYLSULFATE 5 MG/5ML IV SOSY
PREFILLED_SYRINGE | INTRAVENOUS | Status: AC
  Filled 2016-10-01: qty 10

## 2016-10-01 MED ORDER — METHYLPREDNISOLONE ACETATE 40 MG/ML IJ SUSP
INTRAMUSCULAR | Status: AC
  Filled 2016-10-01: qty 1

## 2016-10-01 MED ORDER — ONDANSETRON HCL 4 MG/2ML IJ SOLN
INTRAMUSCULAR | Status: DC | PRN
  Administered 2016-10-01: 4 mg via INTRAVENOUS

## 2016-10-01 MED ORDER — DIAZEPAM 5 MG PO TABS
ORAL_TABLET | ORAL | Status: DC
Start: 2016-10-01 — End: 2016-10-01
  Filled 2016-10-01: qty 1

## 2016-10-01 MED ORDER — FENTANYL CITRATE (PF) 100 MCG/2ML IJ SOLN
100.0000 ug | Freq: Once | INTRAMUSCULAR | Status: AC
  Administered 2016-10-01 (×2): 50 ug via INTRAVENOUS

## 2016-10-01 MED ORDER — PROPOFOL 10 MG/ML IV BOLUS
INTRAVENOUS | Status: DC | PRN
  Administered 2016-10-01: 150 mg via INTRAVENOUS

## 2016-10-01 MED ORDER — INDIGOTINDISULFONATE SODIUM 8 MG/ML IJ SOLN
INTRAMUSCULAR | Status: DC | PRN
  Administered 2016-10-01: 5 mL via INTRAVENOUS

## 2016-10-01 SURGICAL SUPPLY — 60 items
BENZOIN TINCTURE PRP APPL 2/3 (GAUZE/BANDAGES/DRESSINGS) IMPLANT
BUR ROUND PRECISION 4.0 (BURR) ×2 IMPLANT
BUR ROUND PRECISION 4.0MM (BURR) ×1
CANISTER SUCT 3000ML PPV (MISCELLANEOUS) ×3 IMPLANT
CLOSURE WOUND 1/2 X4 (GAUZE/BANDAGES/DRESSINGS)
CONT SPEC STER OR (MISCELLANEOUS) ×3 IMPLANT
CORDS BIPOLAR (ELECTRODE) ×3 IMPLANT
COVER SURGICAL LIGHT HANDLE (MISCELLANEOUS) ×3 IMPLANT
DRAIN CHANNEL 10F 3/8 F FF (DRAIN) IMPLANT
DRAPE POUCH INSTRU U-SHP 10X18 (DRAPES) ×3 IMPLANT
DRAPE SURG 17X23 STRL (DRAPES) ×12 IMPLANT
DURAPREP 26ML APPLICATOR (WOUND CARE) ×3 IMPLANT
ELECT BLADE 4.0 EZ CLEAN MEGAD (MISCELLANEOUS)
ELECT BLADE 6.5 EXT (BLADE) IMPLANT
ELECT CAUTERY BLADE 6.4 (BLADE) ×3 IMPLANT
ELECT REM PT RETURN 9FT ADLT (ELECTROSURGICAL) ×3
ELECTRODE BLDE 4.0 EZ CLN MEGD (MISCELLANEOUS) IMPLANT
ELECTRODE REM PT RTRN 9FT ADLT (ELECTROSURGICAL) ×1 IMPLANT
EVACUATOR SILICONE 100CC (DRAIN) IMPLANT
FILTER STRAW FLUID ASPIR (MISCELLANEOUS) ×3 IMPLANT
GAUZE SPONGE 4X4 12PLY STRL (GAUZE/BANDAGES/DRESSINGS) IMPLANT
GAUZE SPONGE 4X4 16PLY XRAY LF (GAUZE/BANDAGES/DRESSINGS) IMPLANT
GLOVE BIO SURGEON STRL SZ7 (GLOVE) ×3 IMPLANT
GLOVE BIO SURGEON STRL SZ8 (GLOVE) ×3 IMPLANT
GLOVE BIOGEL PI IND STRL 8 (GLOVE) ×1 IMPLANT
GLOVE BIOGEL PI INDICATOR 8 (GLOVE) ×2
GOWN STRL REUS W/ TWL LRG LVL3 (GOWN DISPOSABLE) ×2 IMPLANT
GOWN STRL REUS W/TWL LRG LVL3 (GOWN DISPOSABLE) ×4
IV CATH 14GX2 1/4 (CATHETERS) ×3 IMPLANT
KIT BASIN OR (CUSTOM PROCEDURE TRAY) ×3 IMPLANT
KIT ROOM TURNOVER OR (KITS) ×3 IMPLANT
NEEDLE 18GX1X1/2 (RX/OR ONLY) (NEEDLE) ×3 IMPLANT
NEEDLE HYPO 25GX1X1/2 BEV (NEEDLE) ×3 IMPLANT
NEEDLE SPNL 18GX3.5 QUINCKE PK (NEEDLE) ×6 IMPLANT
NEEDLE SPNL 22GX3.5 QUINCKE BK (NEEDLE) IMPLANT
NS IRRIG 1000ML POUR BTL (IV SOLUTION) ×3 IMPLANT
PACK LAMINECTOMY ORTHO (CUSTOM PROCEDURE TRAY) ×3 IMPLANT
PACK UNIVERSAL I (CUSTOM PROCEDURE TRAY) ×3 IMPLANT
PAD ARMBOARD 7.5X6 YLW CONV (MISCELLANEOUS) ×6 IMPLANT
PATTIES SURGICAL .5 X.5 (GAUZE/BANDAGES/DRESSINGS) IMPLANT
PATTIES SURGICAL .5 X1 (DISPOSABLE) ×3 IMPLANT
PATTIES SURGICAL 1X1 (DISPOSABLE) IMPLANT
STRIP CLOSURE SKIN 1/2X4 (GAUZE/BANDAGES/DRESSINGS) IMPLANT
SURGIFLO W/THROMBIN 8M KIT (HEMOSTASIS) ×3 IMPLANT
SUT ETHILON 3 0 FSL (SUTURE) IMPLANT
SUT VIC AB 0 CT1 27 (SUTURE)
SUT VIC AB 0 CT1 27XBRD ANBCTR (SUTURE) IMPLANT
SUT VIC AB 0 CT2 27 (SUTURE) ×3 IMPLANT
SUT VIC AB 1 CT1 18XCR BRD 8 (SUTURE) ×1 IMPLANT
SUT VIC AB 1 CT1 8-18 (SUTURE) ×2
SUT VIC AB 2-0 CT2 18 VCP726D (SUTURE) ×3 IMPLANT
SYR 20CC LL (SYRINGE) IMPLANT
SYR BULB IRRIGATION 50ML (SYRINGE) ×3 IMPLANT
SYR CONTROL 10ML LL (SYRINGE) ×3 IMPLANT
SYR TB 1ML 26GX3/8 SAFETY (SYRINGE) ×6 IMPLANT
SYR TB 1ML LUER SLIP (SYRINGE) ×6 IMPLANT
TOWEL OR 17X24 6PK STRL BLUE (TOWEL DISPOSABLE) IMPLANT
TOWEL OR 17X26 10 PK STRL BLUE (TOWEL DISPOSABLE) IMPLANT
WATER STERILE IRR 1000ML POUR (IV SOLUTION) IMPLANT
YANKAUER SUCT BULB TIP NO VENT (SUCTIONS) ×3 IMPLANT

## 2016-10-01 NOTE — Transfer of Care (Signed)
Immediate Anesthesia Transfer of Care Note  Patient: Brylynn Kincannon  Procedure(s) Performed: Procedure(s) with comments: RIGHT SIDEDE LUMBAR 5-SACRUM 1 MICRODISCECTOMY (Right) - RIGHT SIDEDE LUMBAR 5-SACRUM 1 MICRODISCECTOMY   Patient Location: PACU  Anesthesia Type:General  Level of Consciousness: awake, alert , oriented and patient cooperative  Airway & Oxygen Therapy: Patient Spontanous Breathing and Patient connected to nasal cannula oxygen  Post-op Assessment: Report given to RN and Post -op Vital signs reviewed and stable  Post vital signs: Reviewed and stable  Last Vitals:  Vitals:   10/01/16 1254 10/01/16 1901  BP: 114/66 131/61  Pulse: 83   Resp: 20 19  Temp: 36.9 C 36.1 C    Last Pain:  Vitals:   10/01/16 1308  TempSrc:   PainSc: 4       Patients Stated Pain Goal: 2 (60/63/01 6010)  Complications: No apparent anesthesia complications

## 2016-10-01 NOTE — Anesthesia Procedure Notes (Signed)
Performed by: Kristi Hyer B       

## 2016-10-01 NOTE — Anesthesia Preprocedure Evaluation (Signed)
Anesthesia Evaluation  Patient identified by MRN, date of birth, ID band Patient awake    Reviewed: Allergy & Precautions, NPO status , Patient's Chart, lab work & pertinent test results  Airway Mallampati: I       Dental  (+) Teeth Intact   Pulmonary neg pulmonary ROS,    breath sounds clear to auscultation       Cardiovascular negative cardio ROS   Rhythm:Regular Rate:Normal     Neuro/Psych negative neurological ROS  negative psych ROS   GI/Hepatic negative GI ROS, Neg liver ROS,   Endo/Other  negative endocrine ROS  Renal/GU negative Renal ROS  negative genitourinary   Musculoskeletal negative musculoskeletal ROS (+)   Abdominal   Peds negative pediatric ROS (+)  Hematology negative hematology ROS (+)   Anesthesia Other Findings   Reproductive/Obstetrics negative OB ROS                             Anesthesia Physical Anesthesia Plan  ASA: I  Anesthesia Plan: General   Post-op Pain Management:    Induction: Intravenous  Airway Management Planned: Oral ETT  Additional Equipment:   Intra-op Plan:   Post-operative Plan: Extubation in OR  Informed Consent: I have reviewed the patients History and Physical, chart, labs and discussed the procedure including the risks, benefits and alternatives for the proposed anesthesia with the patient or authorized representative who has indicated his/her understanding and acceptance.   Dental advisory given  Plan Discussed with: CRNA  Anesthesia Plan Comments:         Anesthesia Quick Evaluation

## 2016-10-01 NOTE — Anesthesia Procedure Notes (Signed)
Procedure Name: Intubation Date/Time: 10/01/2016 5:09 PM Performed by: Oletta Lamas Pre-anesthesia Checklist: Patient identified, Emergency Drugs available, Suction available and Patient being monitored Patient Re-evaluated:Patient Re-evaluated prior to inductionOxygen Delivery Method: Circle System Utilized Preoxygenation: Pre-oxygenation with 100% oxygen Intubation Type: IV induction Ventilation: Mask ventilation without difficulty Laryngoscope Size: Mac and 3 Grade View: Grade I Tube type: Oral Tube size: 7.5 mm Number of attempts: 1 Airway Equipment and Method: Stylet Placement Confirmation: ETT inserted through vocal cords under direct vision,  positive ETCO2 and breath sounds checked- equal and bilateral Secured at: 22 cm Tube secured with: Tape Dental Injury: Teeth and Oropharynx as per pre-operative assessment

## 2016-10-02 ENCOUNTER — Encounter (HOSPITAL_COMMUNITY): Payer: Self-pay | Admitting: Orthopedic Surgery

## 2016-10-02 NOTE — Op Note (Addendum)
NAME:  Rachel, Wiggins NO.:  MEDICAL RECORD NO.:  [id number]  LOCATION:                                 FACILITY:  PHYSICIAN:  Phylliss Bob, MD           DATE OF BIRTH:  DATE OF PROCEDURE:  10/01/2016                              OPERATIVE REPORT   PREOPERATIVE DIAGNOSIS: 1. Right-sided S1 radiculopathy. 2. Moderate right L5-S1 disk herniation.  POSTOPERATIVE DIAGNOSIS: 1. Right-sided S1 radiculopathy. 2. Moderate right L5-S1 disk herniation.  PROCEDURES:  Right-sided L5-S1 microdiskectomy with right-sided L5-S1 laminotomy and partial facetectomy.  SURGEON:  Phylliss Bob, MD  ASSISTANT:  Pricilla Holm, PA-C.  ANESTHESIA:  General endotracheal anesthesia.  COMPLICATIONS:  None.  DISPOSITION:  Stable.  ESTIMATED BLOOD LOSS:  Minimal.  INDICATIONS FOR SURGERY:  Briefly, Rachel Wiggins is a pleasant 46 year old female who did present to me with a history of severe debilitating pain in her right leg.  An MRI did reveal a moderate right-sided L5-S1 disk herniation.  We did proceed with appropriate conservative treatment and measures, however, the patient did continue to have ongoing rather debilitating pain.  Given the patient's ongoing pain and dysfunction, we did discuss proceeding with the procedure reflected above.  The patient was fully aware of the risks and limitations of surgery and did elect to proceed.  OPERATIVE DETAILS:  On October 01, 2016, the patient was brought to surgery and general endotracheal anesthesia was administered.  The patient was placed prone on a well-padded flat Jackson bed with a Wilson frame.  Antibiotics were given and a time-out procedure was performed. The back was prepped and draped in the usual sterile fashion.  I then made a midline incision overlying the L5-S1 intervertebral space.  The fascia was incised just to the right of the midline.  The paraspinal musculature was bluntly swept laterally.  A  self-retaining McCulloch retractor was placed.  I then used a high-speed bur to remove the inferior and medial aspect of the L5 lamina.  The ligamentum flavum was identified and the lateral aspect of it was removed.  The traversing right S1 nerve was identified and medially noted to be under substantial tension.  I was able to medially retract the nerve.  Medially upon retraction of the nerve, there was noted to be a very large herniated intervertebral disk fragment.  This was removed uneventfully.  Upon additional probing of the right side of the L5-S1 anulus, additional disc fragments were identified, which were removed uneventfully.  The herniated fragment themselves did result in a small annulotomy at the posterolateral aspect of the right L5 intervertebral space.  Of note, the intervertebral space was not entered at any point, however, the fragments were uneventfully removed, thereby decompressing the nerve.  I was very pleased with the decompression of the nerve, as I was easily able to mobilize the nerve both medially and laterally without any compression.  Bleeding was then controlled using bipolar electrocautery in addition to Surgiflo.  A 30 mg of Depo-Medrol was then introduced about the epidural space.  The wound was copiously irrigated with approximately 500 mL of normal saline prior  to placing the Depo-Medrol. The fascia was then closed using #1 Vicryl.  The subcutaneous layer was closed using 2-0 Vicryl and the skin was closed using 4-0 Monocryl. Benzoin and Steri-Strips were applied followed by sterile dressing.  All instrument counts were correct at the termination of the procedure.  Of note, Pricilla Holm was my assistant throughout surgery, and did aid in retraction, suctioning, and closure from start to finish.     Phylliss Bob, MD     MD/MEDQ  D:  10/01/2016  T:  10/01/2016  Job:  [id number]

## 2016-10-06 NOTE — Anesthesia Postprocedure Evaluation (Addendum)
Anesthesia Post Note  Patient: Rachel Wiggins  Procedure(s) Performed: Procedure(s) (LRB): RIGHT SIDEDE LUMBAR 5-SACRUM 1 MICRODISCECTOMY (Right)  Patient location during evaluation: PACU Anesthesia Type: General Level of consciousness: awake and alert Pain management: pain level controlled Vital Signs Assessment: post-procedure vital signs reviewed and stable Respiratory status: spontaneous breathing, nonlabored ventilation, respiratory function stable and patient connected to nasal cannula oxygen Cardiovascular status: blood pressure returned to baseline and stable Postop Assessment: no signs of nausea or vomiting Anesthetic complications: no       Last Vitals:  Vitals:   10/01/16 2000 10/01/16 2015  BP: 126/75 122/77  Pulse: 90 84  Resp: (!) 8 16  Temp:  36.2 C    Last Pain:  Vitals:   10/01/16 2015  TempSrc:   PainSc: 2                  Alayza Pieper

## 2016-10-06 NOTE — Addendum Note (Signed)
Addendum  created 10/06/16 1355 by Josephine Igo, CRNA   Anesthesia Intra Meds edited

## 2016-12-15 NOTE — Addendum Note (Signed)
Addendum  created 12/15/16 1229 by Aliveah Gallant, MD   Sign clinical note    

## 2017-05-15 ENCOUNTER — Other Ambulatory Visit: Payer: Self-pay | Admitting: Orthopedic Surgery

## 2017-05-15 DIAGNOSIS — M5416 Radiculopathy, lumbar region: Secondary | ICD-10-CM

## 2017-05-28 ENCOUNTER — Other Ambulatory Visit: Payer: Self-pay | Admitting: Obstetrics and Gynecology

## 2017-05-28 DIAGNOSIS — N6310 Unspecified lump in the right breast, unspecified quadrant: Secondary | ICD-10-CM

## 2017-06-09 ENCOUNTER — Ambulatory Visit
Admission: RE | Admit: 2017-06-09 | Discharge: 2017-06-09 | Disposition: A | Discharge status: Home / Self Care | Payer: 59 | Source: Ambulatory Visit | Attending: Obstetrics and Gynecology | Admitting: Obstetrics and Gynecology

## 2017-06-09 DIAGNOSIS — N6310 Unspecified lump in the right breast, unspecified quadrant: Secondary | ICD-10-CM

## 2017-07-03 ENCOUNTER — Ambulatory Visit: Payer: 59 | Admitting: Obstetrics and Gynecology

## 2017-07-16 ENCOUNTER — Other Ambulatory Visit: Payer: Self-pay | Admitting: Obstetrics and Gynecology

## 2017-07-16 NOTE — Telephone Encounter (Signed)
Medication refill request: Levothyroxine and FLUoxetine Last AEX:  06/23/16 BS Next AEX: 09/09/17  Last MMG (if hormonal medication request): 06/09/17 MM and US Breast Right - BIRADS 2 benign/density d Refill authorized: Please advise on refills

## 2017-07-16 NOTE — Telephone Encounter (Signed)
I've sent in a one month script with one refill. Please have the patient come in for a TSH.

## 2017-07-17 NOTE — Telephone Encounter (Signed)
Spoke with patient, advised as seen below per Dr. Talbert Nan. Patient states she is scheduled for AEX with Dr. Quincy Simmonds on 09/09/17, will have labs drawn at that time, declines earlier lab appointment. Patient states AEX has been rescheduled x2 d/t provider LOA.   Advised will update Dr. Talbert Nan. Patient verbalizes understanding.   Routing to provider for final review. Patient is agreeable to disposition. Will close encounter.

## 2017-07-23 ENCOUNTER — Ambulatory Visit: Payer: 59 | Admitting: Obstetrics and Gynecology

## 2017-09-09 ENCOUNTER — Other Ambulatory Visit (HOSPITAL_COMMUNITY)
Admission: RE | Admit: 2017-09-09 | Discharge: 2017-09-09 | Disposition: A | Discharge status: Home / Self Care | Payer: 59 | Source: Ambulatory Visit | Attending: Obstetrics and Gynecology | Admitting: Obstetrics and Gynecology

## 2017-09-09 ENCOUNTER — Encounter: Payer: Self-pay | Admitting: Obstetrics and Gynecology

## 2017-09-09 ENCOUNTER — Ambulatory Visit: Payer: 59 | Admitting: Obstetrics and Gynecology

## 2017-09-09 ENCOUNTER — Other Ambulatory Visit: Payer: Self-pay

## 2017-09-09 VITALS — BP 120/76 | HR 76 | Resp 14 | Ht 64.5 in | Wt 180.8 lb

## 2017-09-09 DIAGNOSIS — Z01419 Encounter for gynecological examination (general) (routine) without abnormal findings: Secondary | ICD-10-CM

## 2017-09-09 DIAGNOSIS — Z1211 Encounter for screening for malignant neoplasm of colon: Secondary | ICD-10-CM

## 2017-09-09 MED ORDER — LEVOTHYROXINE SODIUM 75 MCG PO TABS: ORAL_TABLET | ORAL | 3 refills | Status: DC

## 2017-09-09 MED ORDER — FLUOXETINE HCL 20 MG PO CAPS: 20.0000 mg | ORAL_CAPSULE | Freq: Every day | ORAL | 3 refills | Status: DC

## 2017-09-09 NOTE — Addendum Note (Signed)
Addended by: Yisroel Ramming, Enrrique Mierzwa E on: 09/09/2017 12:20 PM   Modules accepted: Orders

## 2017-09-09 NOTE — Progress Notes (Signed)
47 y.o. G32P2002 Married Caucasian female here for annual exam.    Taking a supplement from [place] for weight loss.  Lost 8 kilos.  Feels like it is water weight loss as she is voiding a lot.   Has [name]. Vaginal spotting this month which was more light brown.  Random spotting.   Depression/anxiety/difficulty with memory.  Having anxiety attacks and palpitations.  Daughter is gong through difficulty time.  Patient is on Prozac and Synthroid.  Needs refills.  Had back surgery last year.  Feeling well.  Now doing pilates.  Migraine headaches resolved now. Sleeping better.   PCP:   Dr.Todd  Patient's last menstrual period was 08/28/2017 (exact date).           Sexually active: Yes.    The current method of family planning is [name] inserted 09-17-15.    Exercising: Yes.    Pilates Smoker:  no  Health Maintenance: Pap:  03-01-14 Neg:Neg HR HPV History of abnormal Pap:  no MMG:  06/09/17 Bilateral Diagnostic/Right Breast US -- BIRADS 2 benign/density d TDaP:  2010 Gardasil:   no HIV: with pregnancy. Hep C:  NA   reports that  has never smoked. she has never used smokeless tobacco. She reports that she does not drink alcohol or use drugs.  Past Medical History:  Diagnosis Date  . Anxiety   . Depression   . Hypothyroidism   . Infertility, female   . Migraine headache with aura   . Sciatica   . Thyroid disease     Past Surgical History:  Procedure Laterality Date  . CESAREAN SECTION     2 times  . hemorrhectomy  09/2007   --in [place]  . LUMBAR LAMINECTOMY Right 10/01/2016   Procedure: RIGHT SIDEDE LUMBAR 5-SACRUM 1 MICRODISCECTOMY;  Surgeon: Phylliss Bob, MD;  Location: [place];  Service: Orthopedics;  Laterality: Right;  RIGHT SIDEDE LUMBAR 5-SACRUM 1 MICRODISCECTOMY   . WISDOM TOOTH EXTRACTION      Current Outpatient Medications  Medication Sig Dispense Refill  . cholecalciferol (VITAMIN D) 1000 units tablet Take 1,000 Units by mouth daily.    Marland Kitchen  FLUoxetine (PROZAC) 20 MG capsule TAKE ONE CAPSULE BY MOUTH DAILY 30 capsule 1  . fluticasone (FLONASE) 50 MCG/ACT nasal spray Place 1 spray into both nostrils as needed for allergies or rhinitis.    Marland Kitchen levonorgestrel (MIRENA) 20 MCG/24HR IUD 1 each by Intrauterine route once.    Marland Kitchen levothyroxine (SYNTHROID, LEVOTHROID) 75 MCG tablet TAKE ONE TABLET BY MOUTH DAILY BEFORE BREAKFAST 30 tablet 1   No current facility-administered medications for this visit.     Family History  Problem Relation Age of Onset  . Diabetes Mother   . Hypertension Mother   . Thyroid disease Mother        hx thyroid nodule  . Hypertension Father   . Hyperlipidemia Father   . Diabetes Maternal Grandmother   . Diabetes Maternal Grandfather   . Diabetes Paternal Grandmother   . Diabetes Paternal Grandfather     ROS:  Pertinent items are noted in HPI.  Otherwise, a comprehensive ROS was negative.  Exam:   BP 120/76 (BP Location: Right Arm, Patient Position: Sitting, Cuff Size: Normal)   Pulse 76   Resp 14   Ht 5' 4.5" (1.638 m)   Wt 180 lb 12.8 oz (82 kg)   LMP 08/28/2017 (Exact Date) Comment: occ. spotting  BMI 30.55 kg/m     General appearance: alert, cooperative and appears stated  age Head: Normocephalic, without obvious abnormality, atraumatic Neck: no adenopathy, supple, symmetrical, trachea midline and thyroid normal to inspection and palpation Lungs: clear to auscultation bilaterally Breasts: normal appearance, no masses or tenderness, No nipple retraction or dimpling, No nipple discharge or bleeding, No axillary or supraclavicular adenopathy Heart: regular rate and rhythm Abdomen: soft, non-tender; no masses, no organomegaly Extremities: extremities normal, atraumatic, no cyanosis or edema Skin: Skin color, texture, turgor normal. No rashes or lesions Lymph nodes: Cervical, supraclavicular, and axillary nodes normal. No abnormal inguinal nodes palpated Neurologic: Grossly normal  Pelvic:  External genitalia:  no lesions              Urethra:  normal appearing urethra with no masses, tenderness or lesions              Bartholins and Skenes: normal                 Vagina: normal appearing vagina with normal color and discharge, no lesions              Cervix: no lesions.  IUD strings noted.              Pap taken: Yes.   Bimanual Exam:  Uterus:  normal size, contour, position, consistency, mobility, non-tender              Adnexa: no mass, fullness, tenderness              Rectal exam: Yes.  .  Confirms.              Anus:  normal sphincter tone, no lesions  Chaperone was present for exam.  Assessment:   Well woman visit with normal exam. Anxiety/depression/panic.   Plan: Mammogram screening discussed. Recommended self breast awareness. Pap and HR HPV as above. Guidelines for Calcium, Vitamin D, regular exercise program including cardiovascular and weight bearing exercise. Referral to [place] therapist. IFOB.  Routine labs. Prozac refill and Synthroid refill. If palpitations persist, to PCP.  Follow up annually and prn.    After visit summary provided.

## 2017-09-09 NOTE — Patient Instructions (Signed)

## 2017-09-10 LAB — CBC
HEMATOCRIT: 40.3 % (ref 34.0–46.6)
HEMOGLOBIN: 13.3 g/dL (ref 11.1–15.9)
MCH: 30.8 pg (ref 26.6–33.0)
MCHC: 33 g/dL (ref 31.5–35.7)
MCV: 93 fL (ref 79–97)
Platelets: 280 10*3/uL (ref 150–379)
RBC: 4.32 x10E6/uL (ref 3.77–5.28)
RDW: 12.7 % (ref 12.3–15.4)
WBC: 6.4 10*3/uL (ref 3.4–10.8)

## 2017-09-10 LAB — COMPREHENSIVE METABOLIC PANEL
ALBUMIN: 4.4 g/dL (ref 3.5–5.5)
ALK PHOS: 68 IU/L (ref 39–117)
ALT: 14 IU/L (ref 0–32)
AST: 15 IU/L (ref 0–40)
Albumin/Globulin Ratio: 1.4 (ref 1.2–2.2)
BUN / CREAT RATIO: 12 (ref 9–23)
BUN: 8 mg/dL (ref 6–24)
Bilirubin Total: 0.3 mg/dL (ref 0.0–1.2)
CO2: 26 mmol/L (ref 20–29)
CREATININE: 0.65 mg/dL (ref 0.57–1.00)
Calcium: 9.2 mg/dL (ref 8.7–10.2)
Chloride: 96 mmol/L (ref 96–106)
GFR calc non Af Amer: 108 mL/min/{1.73_m2} (ref >59)
GFR, EST AFRICAN AMERICAN: 124 mL/min/{1.73_m2} (ref >59)
Globulin, Total: 3.2 g/dL (ref 1.5–4.5)
Glucose: 70 mg/dL (ref 65–99)
Potassium: 3.6 mmol/L (ref 3.5–5.2)
SODIUM: 137 mmol/L (ref 134–144)
TOTAL PROTEIN: 7.6 g/dL (ref 6.0–8.5)

## 2017-09-10 LAB — THYROID PANEL WITH TSH
Free Thyroxine Index: 2.3 (ref 1.2–4.9)
T3 Uptake Ratio: 28 % (ref 24–39)
T4, Total: 8.1 ug/dL (ref 4.5–12.0)
TSH: 0.363 u[IU]/mL — ABNORMAL LOW (ref 0.450–4.500)

## 2017-09-10 LAB — LIPID PANEL
Chol/HDL Ratio: 3.9 ratio (ref 0.0–4.4)
Cholesterol, Total: 203 mg/dL — ABNORMAL HIGH (ref 100–199)
HDL: 52 mg/dL (ref >39)
LDL Calculated: 135 mg/dL — ABNORMAL HIGH (ref 0–99)
Triglycerides: 78 mg/dL (ref 0–149)
VLDL Cholesterol Cal: 16 mg/dL (ref 5–40)

## 2017-09-14 LAB — CYTOLOGY - PAP
Diagnosis: NEGATIVE
HPV (WINDOPATH): NOT DETECTED

## 2017-09-16 LAB — FECAL OCCULT BLOOD, IMMUNOCHEMICAL: IMMUNOLOGICAL FECAL OCCULT BLOOD TEST: NEGATIVE

## 2018-02-26 ENCOUNTER — Telehealth: Payer: Self-pay | Admitting: Obstetrics and Gynecology

## 2018-02-26 ENCOUNTER — Ambulatory Visit: Payer: 59 | Admitting: Obstetrics and Gynecology

## 2018-02-26 ENCOUNTER — Encounter: Payer: Self-pay | Admitting: Obstetrics and Gynecology

## 2018-02-26 VITALS — BP 110/72 | HR 76 | Resp 14 | Ht 64.5 in | Wt 183.0 lb

## 2018-02-26 DIAGNOSIS — N760 Acute vaginitis: Secondary | ICD-10-CM

## 2018-02-26 MED ORDER — FLUCONAZOLE 150 MG PO TABS: 150.0000 mg | ORAL_TABLET | Freq: Once | ORAL | 0 refills | Status: AC

## 2018-02-26 NOTE — Telephone Encounter (Signed)
Call to patient. Patient states she has been having internal and external itching for over a week now. Has been treating with monistat for one week with no relief. Last used monistat last night. Patient states she is not having any discharge. Requests appointment with Dr. Quincy Simmonds. Reviewed with Gay Filler, RN. Patient scheduled for work in at 1345. Patient aware work in appointment and agreeable to date and time.   Routing to provider for final review. Patient agreeable to disposition. Will close encounter.

## 2018-02-26 NOTE — Progress Notes (Addendum)
GYNECOLOGY  VISIT   HPI: 47 y.o.   Married  [place]  female   [phone] with No LMP recorded (lmp unknown). (Menstrual status: IUD).   here for yeast infection.  Used Monistat 6 days ago and did not help.  Was generic.  Itching is mostly on the outside.  No odor.   No recent abx.   Recent stress.   Used a colon cleanser for 14 days.   GYNECOLOGIC HISTORY: No LMP recorded (lmp unknown). (Menstrual status: IUD). Contraception:  [name] Menopausal hormone therapy:  none Last mammogram:  06-09-17 bilateral & rt breast u/s category d density birads 2:neg Last pap smear:   09-09-17 neg HPV HR neg        OB History    Gravida  2   Para  2   Term  2   Preterm      AB      Living  2     SAB      TAB      Ectopic      Multiple      Live Births                 There are no active problems to display for this patient.   Past Medical History:  Diagnosis Date  . Anxiety   . Depression   . Hypothyroidism   . Infertility, female   . Migraine headache with aura   . Sciatica   . Thyroid disease     Past Surgical History:  Procedure Laterality Date  . CESAREAN SECTION     2 times  . hemorrhectomy  09/2007   --in [place]  . LUMBAR LAMINECTOMY Right 10/01/2016   Procedure: RIGHT SIDEDE LUMBAR 5-SACRUM 1 MICRODISCECTOMY;  Surgeon: Phylliss Bob, MD;  Location: [place];  Service: Orthopedics;  Laterality: Right;  RIGHT SIDEDE LUMBAR 5-SACRUM 1 MICRODISCECTOMY   . WISDOM TOOTH EXTRACTION      Current Outpatient Medications  Medication Sig Dispense Refill  . cholecalciferol (VITAMIN D) 1000 units tablet Take 1,000 Units by mouth daily.    Marland Kitchen FLUoxetine (PROZAC) 20 MG capsule Take 1 capsule (20 mg total) by mouth daily. 90 capsule 3  . fluticasone (FLONASE) 50 MCG/ACT nasal spray Place 1 spray into both nostrils as needed for allergies or rhinitis.    Marland Kitchen levonorgestrel (MIRENA) 20 MCG/24HR IUD 1 each by Intrauterine route once.    Marland Kitchen levothyroxine (SYNTHROID,  LEVOTHROID) 75 MCG tablet TAKE ONE TABLET BY MOUTH DAILY BEFORE BREAKFAST 90 tablet 3   No current facility-administered medications for this visit.      ALLERGIES: Peanuts [peanut oil]  Family History  Problem Relation Age of Onset  . Diabetes Mother   . Hypertension Mother   . Thyroid disease Mother        hx thyroid nodule  . Hypertension Father   . Hyperlipidemia Father   . Diabetes Maternal Grandmother   . Diabetes Maternal Grandfather   . Diabetes Paternal Grandmother   . Diabetes Paternal Grandfather     Social History   Socioeconomic History  . Marital status: Married    Spouse name: Not on file  . Number of children: Not on file  . Years of education: Not on file  . Highest education level: Not on file  Occupational History  . Not on file  Social Needs  . Financial resource strain: Not on file  . Food insecurity:    Worry: Not on file  Inability: Not on file  . Transportation needs:    Medical: Not on file    Non-medical: Not on file  Tobacco Use  . Smoking status: Never Smoker  . Smokeless tobacco: Never Used  Substance and Sexual Activity  . Alcohol use: No    Alcohol/week: 0.0 standard drinks  . Drug use: No  . Sexual activity: Yes    Partners: Male    Birth control/protection: IUD    Comment: [name] inserted 09-17-15  Lifestyle  . Physical activity:    Days per week: Not on file    Minutes per session: Not on file  . Stress: Not on file  Relationships  . Social connections:    Talks on phone: Not on file    Gets together: Not on file    Attends religious service: Not on file    Active member of club or organization: Not on file    Attends meetings of clubs or organizations: Not on file    Relationship status: Not on file  . Intimate partner violence:    Fear of current or ex partner: Not on file    Emotionally abused: Not on file    Physically abused: Not on file    Forced sexual activity: Not on file  Other Topics Concern  . Not  on file  Social History Narrative  . Not on file    Review of Systems  Gastrointestinal: Positive for constipation.  Genitourinary:       Vulvar itching Loss of sexual interest Pain with intercourse  All other systems reviewed and are negative.   PHYSICAL EXAMINATION:    BP 110/72 (BP Location: Right Arm, Patient Position: Sitting, Cuff Size: Large)   Pulse 76   Resp 14   Ht 5' 4.5" (1.638 m)   Wt 183 lb (83 kg)   LMP  (LMP Unknown)   BMI 30.93 kg/m     General appearance: alert, cooperative and appears stated age   Pelvic: External genitalia:  Pink vulva.               Urethra:  normal appearing urethra with no masses, tenderness or lesions              Bartholins and Skenes: normal                 Vagina: normal appearing vagina with normal color and discharge, no lesions              Cervix: no lesions.  IUD strings noted.                 Bimanual Exam:  Uterus:  normal size, contour, position, consistency, mobility, non-tender              Adnexa: no mass, fullness, tenderness        Chaperone was present for exam.  ASSESSMENT  Vulvovaginitis.  I suspect yeast. [name].   PLAN  Affirm. Diflucan 150 mg x 1. May repeat in 72 hours prn.  Discussed avoid wet clothing/bathing suits, restrictive clothing.  Try probiotics.  FU prn.    An After Visit Summary was printed and given to the patient.  __15____ minutes face to face time of which over 50% was spent in counseling.

## 2018-02-26 NOTE — Telephone Encounter (Signed)
Patient is having itching after taking monistat for a week. Patient would like an appointment today. To triage to assist with scheduling.

## 2018-02-26 NOTE — Patient Instructions (Signed)
Vaginite  (Vaginitis)  A vaginite  uma inflamao da vagina.  causada normalmente por uma alterao no equilbrio normal das bactrias e leveduras que vivem na vagina. Esta mudana no equilbrio provoca um crescimento excessivo de bactrias ou leveduras, que leva  inflamao. H diferentes tipos de vaginite, mas os tipos mais comuns so:  A vaginose bacteriana.  A infeco por levedura (candidase).  Vaginite tricomonase. Esta  uma infeco sexualmente transmissvel (IST).  Vaginite viral.  Vaginite atrfica.  Vaginite alrgica. CAUSAS  A causa depende do tipo de vaginite. A vaginite pode ser causada por:   Bactrias (vaginose bacteriana).  Levedura (infeco por levedura).  Um parasita (vaginite tricomonase)  Um vrus (vaginite viral).  Baixos nveis hormonais (vaginite atrfica). Baixos nveis hormonais podem ocorrer durante gravidez, amamentao ou [employer].  Fatores que causam irritao, como banhos de espuma, absorventes internos perfumados e sprays femininos (vaginite alrgica). Outros fatores incluem a [place] do equilbrio natural de levedura e bactrias presentes na vagina. Estes incluem:   Antibiticos  Higiene precria.  Diafragmas, esponjas vaginais, espermicidas, plulas anti-concepcionais e dispositivos intrauterinos (DIU).  Relao sexual.  Infeco.  Diabete no controlada.  Sistema imunolgico debilitado. SINTOMAS  Os sintomas podem variar de acordo com a causa da vaginite. Sintomas comuns incluem:   [place] vaginal anormal. ? O sang[place]  branco, cinza ou amarelo com vaginite bacteriana. ? O sangramento  espesso, branco e caseoso com uma infeco de levedura. ? O sangramento  espumoso e amarelado ou esverdeado com tricomonase.  Um mau odor vaginal. ? O odor  estranho com vaginite bacteriana.  Coceira, dor ou inchao vaginais.  Dor durante relaes sexuais.  Dor ou ardor ao urinar. s vezes no h sintomas.    TRATAMENTO  O tratamento varia de acordo com o tipo de infeco.   A vaginite bacteriana e a tricomonase so muitas vezes tratados com cremes ou plulas anti-bacterianos.  Infeces por levedura so muitas vezes tratadas com remdios antifngicos, como cremes ou supositrios vaginais.  A vaginite viral no tem cura, mas os sintomas podem ser tratados com medicamentos que aliviam o desconforto. Seu parceiro sexual tambm deve ser tratado.  A vaginite atrfica pode ser tratada com creme estrgeno, plulas, supositrios ou anis vaginais. Caso ocorra secura vaginal, lubrificantes e cremes hidratantes podem ajudar. Voc pode ser orientado a evitar sabonetes perfumados, sprays ou duchas.  O tratamento da vaginite envolve interromper o uso do produto que causa o problema. Cremes vaginais podem ser usados para tratar os sintomas. INSTRUES PARA TRATAMENTO DOMICILIAR   Tome todos os medicamentos, conforme receitado pelo seu mdico.  Mantenha sua rea genital limpa e seca. Evite sabonetes e s lave a rea com gua.  Evite duchas. Ela pode remover as bactrias saudveis na vagina.  No use absorventes internos ou tenha relaes sexuais at [place]. Use absorventes sanitrios enquanto tiver vaginite.  Realize a higiene de frente para trs. Isso evita que bactrias do reto se espalhem para a vagina.  Deixe sua rea genital ventilada. ? Use roupas ntimas de algodo para evitar o acmulo de umidade. ? Evite usar roupas ntimas enquanto dorme at [employer]. ? Evite calas e roupas ntimas apertadas ou nilons sem um suporte de algodo. ? Retire roupas midas (especialmente roupas de banho) logo que possvel.  Use produtos neutros sem perfume. Evite o uso de agentes irritantes, como: ? Sprays femininos com perfume. ? Amaciantes de tecido. ? Detergentes perfumados. ? Absorventes internos perfumados. ? Sabonetes perfumados ou banhos de espuma.  Pratique  sexo seguro e use preservativos. Preservativos podem evitar que a tricomonase e a vaginite viral se espalhem. PROCURE UM MDICO SE:   Tiver dores abdominais.  Tiver febre ou sintomas persistentes por mais de 2 a 3 dias.  Tiver febre e ou seus sintomas piorarem repentinamente. Estas informaes no se destinam a substituir as recomendaes de seu mdico. No deixe de discutir quaisquer dvidas com seu mdico. Document Released: 03/24/2012 Elsevier Interactive Patient Education  2017 [employer].

## 2018-02-27 LAB — VAGINITIS/VAGINOSIS, DNA PROBE
CANDIDA SPECIES: NEGATIVE
GARDNERELLA VAGINALIS: NEGATIVE
TRICHOMONAS VAG: NEGATIVE

## 2018-05-27 ENCOUNTER — Encounter: Payer: Self-pay | Admitting: Family Medicine

## 2018-05-27 ENCOUNTER — Ambulatory Visit: Payer: 59 | Admitting: Family Medicine

## 2018-05-27 VITALS — BP 124/80 | HR 63 | Ht 65.5 in | Wt 192.2 lb

## 2018-05-27 DIAGNOSIS — R519 Headache, unspecified: Secondary | ICD-10-CM

## 2018-05-27 DIAGNOSIS — R51 Headache: Secondary | ICD-10-CM

## 2018-05-27 DIAGNOSIS — M2550 Pain in unspecified joint: Secondary | ICD-10-CM

## 2018-05-27 DIAGNOSIS — F419 Anxiety disorder, unspecified: Secondary | ICD-10-CM | POA: Diagnosis not present

## 2018-05-27 DIAGNOSIS — Z7689 Persons encountering health services in other specified circumstances: Secondary | ICD-10-CM

## 2018-05-27 DIAGNOSIS — F32A Depression, unspecified: Secondary | ICD-10-CM

## 2018-05-27 DIAGNOSIS — G479 Sleep disorder, unspecified: Secondary | ICD-10-CM

## 2018-05-27 DIAGNOSIS — Z833 Family history of diabetes mellitus: Secondary | ICD-10-CM

## 2018-05-27 DIAGNOSIS — F329 Major depressive disorder, single episode, unspecified: Secondary | ICD-10-CM

## 2018-05-27 DIAGNOSIS — E038 Other specified hypothyroidism: Secondary | ICD-10-CM

## 2018-05-27 DIAGNOSIS — G43019 Migraine without aura, intractable, without status migrainosus: Secondary | ICD-10-CM | POA: Diagnosis not present

## 2018-05-27 DIAGNOSIS — Z8669 Personal history of other diseases of the nervous system and sense organs: Secondary | ICD-10-CM | POA: Insufficient documentation

## 2018-05-27 DIAGNOSIS — E063 Autoimmune thyroiditis: Secondary | ICD-10-CM

## 2018-05-27 DIAGNOSIS — Z8739 Personal history of other diseases of the musculoskeletal system and connective tissue: Secondary | ICD-10-CM

## 2018-05-27 DIAGNOSIS — R609 Edema, unspecified: Secondary | ICD-10-CM

## 2018-05-27 HISTORY — DX: Sleep disorder, unspecified: G47.9

## 2018-05-27 HISTORY — DX: Depression, unspecified: F32.A

## 2018-05-27 MED ORDER — KETOROLAC TROMETHAMINE 60 MG/2ML IM SOLN
60.0000 mg | Freq: Once | INTRAMUSCULAR | Status: AC
  Administered 2018-05-27: 60 mg via INTRAMUSCULAR

## 2018-05-27 NOTE — Progress Notes (Signed)
Subjective:    Patient ID: Rachel Wiggins, female    DOB: April 17, 1971, 47 y.o.   MRN: [id number]  HPI Chief Complaint  Patient presents with  . new pt    new pt, get established. migraine since last night. sees obgyn here   She is new to the practice and here to establish care. She has several concerns today.  Previous medical care:  States she has tried out several PCPs but has not established with one yet. She typically only sees her OB/GYN.  She is from [place] originally. Moved to [place] in 2009 and moved here in 2014.   Other providers: Dr. Quincy Simmonds- OB/GYN Dr. Lynann Bologna - orthopedist   Complaints today include:  States she has a migraine with a stabbing knife like pain behind her right eye. This feels like her usual migraines. She has taken Excedrin but it has not resolved. Requests additional medication for this. No numbness, tingling or weakness. No tinnitus. Denies URI symptoms. No N/V/D. Mild photosensitivity. She has Zofran at home but has not needed it.   Migraine headaches- diagnosed with these in 2006 in [place]. Everyday she reports having headaches but her migraines are 2-3 per week. States they are always on right front behind her right eye or bilateral posterior. Described pain as stabbing. Not pulsating. Reports taking Excedrin 2-3 times per week.  She has been on preventive medication in the past, cannot recall which one but states it made her headache worse.   Occasional auras with vision issue changes. Nausea, photosensitivity and photophobia.  States she has not been able to track any definite triggers for her migraines over the years.   Sleep issues- ongoing for years. Reports difficulty falling asleep. States she recalls her mother giving her sleep medication as a child.  States this has been her "whole life".  She sleeps in a dark room, her husband snores. She sometimes has to leave the room. she tried ear plugs.  States she has tried sleep medication such as  Unisom, melatonin. Now she is taking a "night fatburn" supplement. Bought this on [place]. She is sleeping well with this. Feels rested in the morning. States it has not affected her appetite.   History of restless leg syndrome.   Fluid retention- reports feeling swollen at times in her face and fingers but not in LE extremities. This has been a chronic issue for as long as she can recall.  Drinking a tea from [place] that is a diuretic.    Hypothyroidism - Hashimotos - stable on levothyroxine.   Reports history of RA and ? Fibromyalgia. Denies seeing a rheumatologist since moving here. States she last saw one while living in [place]. She is ready to establish care with a local one to further work up chronic arthralgias.   Drinks alcohol occasionally. Denies smoking, no drug use.   Anxiety and depression- stable on Prozac. Is not involved in counseling. States her mood fluctuates due to missing her family in [place].   Lives with her husband and children. Does not work.  Her husband is an Chief Financial Officer.  Reports strong family history of diabetes.   Denies fever, chills, dizziness, chest pain, palpitations, shortness of breath, abdominal pain, N/V/D, urinary symptoms, LE edema.   Reviewed allergies, medications, past medical, surgical, family, and social history.    Review of Systems Pertinent positives and negatives in the history of present illness.     Objective:   Physical Exam BP 124/80   Pulse 63   Ht  5' 5.5" (1.664 m)   Wt 192 lb 3.2 oz (87.2 kg)   BMI 31.50 kg/m   Alert and oriented and in no acute distress. No sinus tenderness. Tympanic membranes and canals are normal. Pharyngeal area is normal. Neck is supple without adenopathy or thyromegaly. Cardiac exam shows a regular sinus rhythm without murmurs or gallops. Lungs are clear to auscultation. Extremities without edema, pulses intact. Skin is warm and dry, no rash or pallor. CNs intact. PERRLA, EOMs intact. DTRs symmetric.        Assessment & Plan:  Intractable migraine without aura and without status migrainosus - Plan: Ambulatory referral to Neurology, ketorolac (TORADOL) injection 60 mg  Anxiety and depression  Sleep disorder - Plan: CBC with Differential/Platelet, Comprehensive metabolic panel, TSH, T4, free, Ambulatory referral to Neurology  Chronic daily headache - Plan: Ambulatory referral to Neurology  Encounter to establish care  History of restless legs syndrome  History of rheumatoid arthritis - Plan: Rheumatoid factor, Ambulatory referral to Rheumatology  Hypothyroidism due to Hashimoto's thyroiditis - Plan: TSH, T4, free, T3  Arthralgia, unspecified joint - Plan: ANA, Rheumatoid factor, Sedimentation rate, Ambulatory referral to Rheumatology  Fluid retention - Plan: CBC with Differential/Platelet, Comprehensive metabolic panel, TSH, T4, free, T3, Hemoglobin A1c, ANA, Rheumatoid factor, Sedimentation rate  Family history of diabetes mellitus - Plan: Hemoglobin A1c  She is a 47 year old who is new to the practice today.  Currently having her typical migraine and Excedrin did not resolve it. Will give her a Toradol 60 mg IM injection. She has Zofran at home if needed. Chronic daily headaches and 2-3 migraines per week. Unclear as to what medication she has tried in the past. Referral to neurology to assist with daily headaches.  Mood is stable with Prozac. No counseling and is not interested at this time.  Sleep disorder- recalls this being an issue since childhood. Currently taking a supplement she bought online and she reports this is helping. Has tried melatonin alone and it did not help. Will appreciate neurology assistance with this.  ?history of restless leg syndrome and did not notice improvement with gabapentin in the past.  Thyroid disorder- on levothyroxine. Check thyroid panel.  Fluid retention- discussed that she does not have any edema today. She recalls this being a problem for  her her whole life. Drinking a tea from [place] and she thinks this does help.  History of RA and ?fibromyalgia per patient in [place]. No records available. Intermittent arthralgias. No abnormal joints today. Check labs and refer to rheumatologist.  Follow-up pending labs. Spent at least 45 minutes face-to-face with patient and more than 50% was in counseling and coronation of care.  Specific counseling on keeping a headache journal, good sleep hygiene, using caution when taking over-the-counter supplements, healthy diet and exercise.

## 2018-05-28 ENCOUNTER — Telehealth: Payer: Self-pay | Admitting: Internal Medicine

## 2018-05-28 LAB — CBC WITH DIFFERENTIAL/PLATELET
BASOS ABS: 0 10*3/uL (ref 0.0–0.2)
Basos: 0 %
EOS (ABSOLUTE): 0.2 10*3/uL (ref 0.0–0.4)
Eos: 3 %
HEMOGLOBIN: 13.4 g/dL (ref 11.1–15.9)
Hematocrit: 40.3 % (ref 34.0–46.6)
IMMATURE GRANULOCYTES: 0 %
Immature Grans (Abs): 0 10*3/uL (ref 0.0–0.1)
LYMPHS: 23 %
Lymphocytes Absolute: 1.6 10*3/uL (ref 0.7–3.1)
MCH: 30.9 pg (ref 26.6–33.0)
MCHC: 33.3 g/dL (ref 31.5–35.7)
MCV: 93 fL (ref 79–97)
MONOCYTES: 6 %
Monocytes Absolute: 0.4 10*3/uL (ref 0.1–0.9)
NEUTROS PCT: 68 %
Neutrophils Absolute: 4.5 10*3/uL (ref 1.4–7.0)
Platelets: 262 10*3/uL (ref 150–450)
RBC: 4.33 x10E6/uL (ref 3.77–5.28)
RDW: 12.5 % (ref 12.3–15.4)
WBC: 6.8 10*3/uL (ref 3.4–10.8)

## 2018-05-28 LAB — COMPREHENSIVE METABOLIC PANEL
ALBUMIN: 5.1 g/dL (ref 3.5–5.5)
ALT: 17 IU/L (ref 0–32)
AST: 17 IU/L (ref 0–40)
Albumin/Globulin Ratio: 2 (ref 1.2–2.2)
Alkaline Phosphatase: 74 IU/L (ref 39–117)
BUN / CREAT RATIO: 23 (ref 9–23)
BUN: 15 mg/dL (ref 6–24)
Bilirubin Total: 0.5 mg/dL (ref 0.0–1.2)
CALCIUM: 9.5 mg/dL (ref 8.7–10.2)
CHLORIDE: 98 mmol/L (ref 96–106)
CO2: 26 mmol/L (ref 20–29)
CREATININE: 0.64 mg/dL (ref 0.57–1.00)
GFR calc Af Amer: 124 mL/min/{1.73_m2} (ref >59)
GFR calc non Af Amer: 107 mL/min/{1.73_m2} (ref >59)
GLUCOSE: 87 mg/dL (ref 65–99)
Globulin, Total: 2.5 g/dL (ref 1.5–4.5)
POTASSIUM: 4.5 mmol/L (ref 3.5–5.2)
Sodium: 137 mmol/L (ref 134–144)
Total Protein: 7.6 g/dL (ref 6.0–8.5)

## 2018-05-28 LAB — T3: T3, Total: 92 ng/dL (ref 71–180)

## 2018-05-28 LAB — TSH: TSH: 0.957 u[IU]/mL (ref 0.450–4.500)

## 2018-05-28 LAB — HEMOGLOBIN A1C
Est. average glucose Bld gHb Est-mCnc: 108 mg/dL
Hgb A1c MFr Bld: 5.4 % (ref 4.8–5.6)

## 2018-05-28 LAB — SEDIMENTATION RATE: SED RATE: 7 mm/h (ref 0–32)

## 2018-05-28 LAB — RHEUMATOID FACTOR

## 2018-05-28 LAB — ANA: Anti Nuclear Antibody(ANA): NEGATIVE

## 2018-05-28 LAB — T4, FREE: Free T4: 1.11 ng/dL (ref 0.82–1.77)

## 2018-05-28 NOTE — Telephone Encounter (Signed)
Please let me know when pt's labs have been reviewed by you so I can send them to [facility] for referral. They are needing those labs before they can proceed with referral. They close at 12 today but I advise that it may be Monday

## 2018-05-30 NOTE — Telephone Encounter (Signed)
They were reviewed and I sent you and the patient notes about them.

## 2018-05-31 NOTE — Telephone Encounter (Signed)
Faxed over results.

## 2018-06-01 ENCOUNTER — Encounter: Payer: Self-pay | Admitting: Neurology

## 2018-06-04 NOTE — Progress Notes (Signed)
NEUROLOGY CONSULTATION NOTE  Judaea Burgoon MRN: [id number] DOB: 05-14-1971  Referring provider: Harland Dingwall, NP Primary care provider: Harland Dingwall, NP  Reason for consult:  headaches  HISTORY OF PRESENT ILLNESS: Rachel Wiggins is a 47 year old right-handed female with anxiety and depression, hypothyroidism, sleep disorder, and migraines who presents for headaches.  History supplemented by referring providers note.  Onset:  Always had mild headaches.  First migraine occurred in her early 27s. Location: Right retro-orbital or back of the head bilaterally Quality: Stabbing Intensity:  Migraines are severe.  She denies new headache, thunderclap headache  Aura:  Blinking lights prior to headache Prodrome:  no Postdrome:  no Associated symptoms: Sometimes nausea.  Photophobia, phonophobia.   She denies associated vomiting, autonomic symptoms, unilateral numbness or weakness. Duration:  Entire day.  Sometimes wakes up with migraine. Frequency:  Once a week Frequency of abortive medication: 3 or 4 times a week (Tylenol, Advil, rarely Excedrin) Triggers: Unknown Relieving factors:  sleep Activity:  Aggravates migraines She does have near-daily mild headache as well. Rescue protocol:  Tylenol or Advil.  Rarely Excedrin Current NSAIDS:  Advil Current steroids:  Tylenol.  Prednisone course currently for hand stiffness (questionable arthritis) Current analgesics: Excedrin Current triptans:  none Current ergotamine:  none Current anti-emetic:  none Current muscle relaxants:  none Current anti-anxiolytic:  none Current sleep aide:  none Current Antihypertensive medications:  none Current Antidepressant medications: Prozac 20 mg Current Anticonvulsant medications:  none Current anti-CGRP:  none Current Vitamins/Herbal/Supplements:  D Current Antihistamines/Decongestants:  Flonase Other therapy:  none Hormone/birth control: Mirena  Past NSAIDS:  Advil Past analgesics:   none Past abortive triptans:  none Past abortive ergotamine:  none Past muscle relaxants:  none Past anti-emetic:  none Past antihypertensive medications:  none Past antidepressant medications:  none Past anticonvulsant medications:  none Past anti-CGRP:  none Past vitamins/Herbal/Supplements:  none Past antihistamines/decongestants:  none Other past therapies:  none  Caffeine:  No  Diet:  Hydrates Exercise:  yes Depression: Yes; Anxiety: Yes Other pain: hand stiffness Sleep hygiene: History of insomnia.   Family history of headache:  Daughter (migraines)  05/27/18 LABS: CBC with WBC 6.8, Hgb 13.4, HCT 40.3, PLT 262; CMP with NA 137, K4.5, CL 98, CO2 26, glucose 87, BUN 15, CR 0.64, T bili 0.5, ALP 74, AST 17, ALT 17; ANA negative, sed rate 7, RF negative.  PAST MEDICAL HISTORY: Past Medical History:  Diagnosis Date  . Anxiety   . Anxiety and depression 05/27/2018  . Depression   . Hypothyroidism   . Infertility, female   . Migraine headache with aura   . Sciatica   . Sleep disorder 05/27/2018  . Thyroid disease     PAST SURGICAL HISTORY: Past Surgical History:  Procedure Laterality Date  . CESAREAN SECTION     2 times  . hemorrhectomy  09/2007   --in [place]  . LUMBAR LAMINECTOMY Right 10/01/2016   Procedure: RIGHT SIDEDE LUMBAR 5-SACRUM 1 MICRODISCECTOMY;  Surgeon: Phylliss Bob, MD;  Location: [facility];  Service: Orthopedics;  Laterality: Right;  RIGHT SIDEDE LUMBAR 5-SACRUM 1 MICRODISCECTOMY   . WISDOM TOOTH EXTRACTION      MEDICATIONS: Current Outpatient Medications on File Prior to Visit  Medication Sig Dispense Refill  . cholecalciferol (VITAMIN D) 1000 units tablet Take 1,000 Units by mouth daily.    Marland Kitchen FLUoxetine (PROZAC) 20 MG capsule Take 1 capsule (20 mg total) by mouth daily. 90 capsule 3  . fluticasone (FLONASE) 50 MCG/ACT nasal spray  Place 1 spray into both nostrils as needed for allergies or rhinitis.    Marland Kitchen levonorgestrel (MIRENA) 20 MCG/24HR IUD 1 each  by Intrauterine route once.    Marland Kitchen levothyroxine (SYNTHROID, LEVOTHROID) 75 MCG tablet TAKE ONE TABLET BY MOUTH DAILY BEFORE BREAKFAST 90 tablet 3   No current facility-administered medications on file prior to visit.     ALLERGIES: Allergies  Allergen Reactions  . Peanuts [Peanut Oil] Shortness Of Breath and Swelling    When eaten in excess    FAMILY HISTORY: Family History  Problem Relation Age of Onset  . Diabetes Mother   . Hypertension Mother   . Thyroid disease Mother        hx thyroid nodule  . Hypertension Father   . Hyperlipidemia Father   . Diabetes Maternal Grandmother   . Diabetes Maternal Grandfather   . Diabetes Paternal Grandmother   . Diabetes Paternal Grandfather    SOCIAL HISTORY: Social History   Socioeconomic History  . Marital status: Married    Spouse name: Not on file  . Number of children: Not on file  . Years of education: Not on file  . Highest education level: Not on file  Occupational History  . Not on file  Social Needs  . Financial resource strain: Not on file  . Food insecurity:    Worry: Not on file    Inability: Not on file  . Transportation needs:    Medical: Not on file    Non-medical: Not on file  Tobacco Use  . Smoking status: Never Smoker  . Smokeless tobacco: Never Used  Substance and Sexual Activity  . Alcohol use: No    Alcohol/week: 0.0 standard drinks  . Drug use: No  . Sexual activity: Yes    Partners: Male    Birth control/protection: IUD    Comment: [name] inserted 09-17-15  Lifestyle  . Physical activity:    Days per week: Not on file    Minutes per session: Not on file  . Stress: Not on file  Relationships  . Social connections:    Talks on phone: Not on file    Gets together: Not on file    Attends religious service: Not on file    Active member of club or organization: Not on file    Attends meetings of clubs or organizations: Not on file    Relationship status: Not on file  . Intimate partner  violence:    Fear of current or ex partner: Not on file    Emotionally abused: Not on file    Physically abused: Not on file    Forced sexual activity: Not on file  Other Topics Concern  . Not on file  Social History Narrative  . Not on file    REVIEW OF SYSTEMS: Constitutional: No fevers, chills, or sweats, no generalized fatigue, change in appetite Eyes: No visual changes, double vision, eye pain Ear, nose and throat: No hearing loss, ear pain, nasal congestion, sore throat Cardiovascular: No chest pain, palpitations Respiratory:  No shortness of breath at rest or with exertion, wheezes GastrointestinaI: No nausea, vomiting, diarrhea, abdominal pain, fecal incontinence Genitourinary:  No dysuria, urinary retention or frequency Musculoskeletal:  No neck pain, back pain Integumentary: No rash, pruritus, skin lesions Neurological: as above Psychiatric: No depression, insomnia, anxiety Endocrine: No palpitations, fatigue, diaphoresis, mood swings, change in appetite, change in weight, increased thirst Hematologic/Lymphatic:  No purpura, petechiae. Allergic/Immunologic: no itchy/runny eyes, nasal congestion, recent allergic reactions, rashes  PHYSICAL EXAM: Blood pressure 104/72, pulse 75, height 5\' 5"  (1.651 m), weight 189 lb (85.7 kg), SpO2 94 %. General: No acute distress.  Patient appears well-groomed.   Head:  Normocephalic/atraumatic Eyes:  fundi examined but not visualized Neck: supple, no paraspinal tenderness, full range of motion Back: No paraspinal tenderness Heart: regular rate and rhythm Lungs: Clear to auscultation bilaterally. Vascular: No carotid bruits. Neurological Exam: Mental status: alert and oriented to person, place, and time, recent and remote memory intact, fund of knowledge intact, attention and concentration intact, speech fluent and not dysarthric, language intact. Cranial nerves: CN I: not tested CN II: pupils equal, round and reactive to light,  visual fields intact CN III, IV, VI:  full range of motion, no nystagmus, no ptosis CN V: facial sensation intact CN VII: upper and lower face symmetric CN VIII: hearing intact CN IX, X: gag intact, uvula midline CN XI: sternocleidomastoid and trapezius muscles intact CN XII: tongue midline Bulk & Tone: normal, no fasciculations. Motor:  5/5 throughout.  Sensation:  temperature and vibration sensation intact.   Deep Tendon Reflexes:  2+ throughout, toes downgoing.  Finger to nose testing:  Postural and intention tremor bilaterally Heel to shin:  Without dysmetria.   Gait:  Normal station and stride.  Romberg negative  IMPRESSION: Migraine with aura, without status migrainosus, not intractable Essential tremor  PLAN: 1.  For preventative management, start topiramate 50mg  at bedtime.  Advised to take precautions not to get pregnant.  May decrease efficacy of Mirena but typically at higher doses.  May treat the tremor as well. 2.  Stop OTC pain relievers.  For abortive therapy, sumatriptan 100mg  3.  Limit use of pain relievers to no more than 2 days out of week to prevent risk of rebound or medication-overuse headache. 4.  Keep headache diary 5.  Exercise, hydration, caffeine cessation, sleep hygiene, monitor for and avoid triggers 6.  Consider:  magnesium citrate 400mg  daily, riboflavin 400mg  daily, and coenzyme Q10 100mg  three times daily 7.  Follow up in 3 to 4 months   Thank you for allowing me to take part in the care of this patient.  Metta Clines, DO  CC: Harland Dingwall, NP

## 2018-06-07 ENCOUNTER — Ambulatory Visit: Payer: 59 | Admitting: Neurology

## 2018-06-07 ENCOUNTER — Encounter: Payer: Self-pay | Admitting: Neurology

## 2018-06-07 VITALS — BP 104/72 | HR 75 | Ht 65.0 in | Wt 189.0 lb

## 2018-06-07 DIAGNOSIS — G43109 Migraine with aura, not intractable, without status migrainosus: Secondary | ICD-10-CM | POA: Diagnosis not present

## 2018-06-07 MED ORDER — TOPIRAMATE 50 MG PO TABS: 50.0000 mg | ORAL_TABLET | Freq: Every day | ORAL | 3 refills | Status: DC

## 2018-06-07 MED ORDER — SUMATRIPTAN SUCCINATE 100 MG PO TABS: ORAL_TABLET | ORAL | 3 refills | Status: DC

## 2018-06-07 NOTE — Patient Instructions (Signed)
Migraine Recommendations: 1.  Start topiramate 50mg  at bedtime.  Call in 4 weeks with update and we can adjust dose if needed. 2.  Take sumatriptan 100mg  at earliest onset of headache.  May repeat dose once in 2 hours if needed.  Do not exceed two tablets in 24 hours. 3.  STOP ADVIL and TYLENOL.  Limit use of pain relievers to no more than 2 days out of the week.  These medications include acetaminophen, ibuprofen, triptans and narcotics.  This will help reduce risk of rebound headaches. 4.  Be aware of common food triggers such as processed sweets, processed foods with nitrites (such as deli meat, hot dogs, sausages), foods with MSG, alcohol (such as wine), chocolate, certain cheeses, certain fruits (dried fruits, bananas, pineapple), vinegar, diet soda. 4.  Avoid caffeine 5.  Routine exercise 6.  Proper sleep hygiene 7.  Stay adequately hydrated with water 8.  Keep a headache diary. 9.  Maintain proper stress management. 10.  Do not skip meals. 11.  Consider supplements:  Magnesium citrate 400mg  to 600mg  daily, riboflavin 400mg , Coenzyme Q 10 100mg  three times daily

## 2018-06-15 ENCOUNTER — Encounter: Payer: Self-pay | Admitting: Family Medicine

## 2018-07-14 HISTORY — PX: BREAST BIOPSY: SHX20

## 2018-07-26 ENCOUNTER — Other Ambulatory Visit: Payer: Self-pay | Admitting: Obstetrics and Gynecology

## 2018-07-26 DIAGNOSIS — Z1231 Encounter for screening mammogram for malignant neoplasm of breast: Secondary | ICD-10-CM

## 2018-08-26 ENCOUNTER — Ambulatory Visit: Payer: 59

## 2018-09-01 ENCOUNTER — Telehealth: Payer: Self-pay | Admitting: Obstetrics and Gynecology

## 2018-09-01 NOTE — Telephone Encounter (Signed)
Left message on voicemail to call and reschedule cancelled appointment. °

## 2018-09-15 ENCOUNTER — Other Ambulatory Visit: Payer: Self-pay

## 2018-09-15 ENCOUNTER — Ambulatory Visit: Payer: 59

## 2018-09-15 NOTE — Telephone Encounter (Signed)
Medication refill request: Fluoxetine HCL 20 MG Last AEX: 09/09/17  Next AEX: 12/13/18 Last MMG (if hormonal medication request): 06/09/17 Bi-rads 2 benign Refill authorized: #90 and 1 RF

## 2018-09-16 ENCOUNTER — Other Ambulatory Visit: Payer: Self-pay | Admitting: *Deleted

## 2018-09-16 MED ORDER — FLUOXETINE HCL 20 MG PO CAPS: 20.0000 mg | ORAL_CAPSULE | Freq: Every day | ORAL | 0 refills | Status: DC

## 2018-09-16 MED ORDER — LEVOTHYROXINE SODIUM 75 MCG PO TABS: ORAL_TABLET | ORAL | 0 refills | Status: DC

## 2018-09-16 NOTE — Telephone Encounter (Signed)
Medication refill request: Levothyroxine 75 MCG  Last AEX:  09-09-17 BS  Next AEX: 12-13-2018  Last MMG (if hormonal medication request): 06-09-17 density D/BIRADS 2 benign, scheduled for 10-13-2018 Refill authorized: 09-09-17 #90, 3RF. Today, please advise.   Medication pended for #90, 0RF. Please refill if appropriate.

## 2018-09-27 ENCOUNTER — Ambulatory Visit: Payer: 59 | Admitting: Obstetrics and Gynecology

## 2018-10-01 ENCOUNTER — Telehealth: Payer: Self-pay | Admitting: Neurology

## 2018-10-01 NOTE — Telephone Encounter (Signed)
Patient cancelled her appointment for 10/06/18. She called in to say that she is doing well and wanted to cancel for now. I offered her an appointment for a Video call but she said for now she is fine and will call back to schedule the Video call if needed. Thanks

## 2018-10-06 ENCOUNTER — Ambulatory Visit: Payer: 59 | Admitting: Neurology

## 2018-10-13 ENCOUNTER — Ambulatory Visit: Payer: 59

## 2018-11-29 ENCOUNTER — Ambulatory Visit: Payer: 59

## 2018-12-09 ENCOUNTER — Other Ambulatory Visit: Payer: Self-pay

## 2018-12-13 ENCOUNTER — Other Ambulatory Visit: Payer: Self-pay

## 2018-12-13 ENCOUNTER — Encounter: Payer: Self-pay | Admitting: Obstetrics and Gynecology

## 2018-12-13 ENCOUNTER — Telehealth: Payer: Self-pay | Admitting: Obstetrics and Gynecology

## 2018-12-13 ENCOUNTER — Ambulatory Visit: Payer: 59 | Admitting: Obstetrics and Gynecology

## 2018-12-13 VITALS — BP 126/78 | HR 88 | Temp 97.7°F | Resp 14 | Ht 64.75 in | Wt 184.0 lb

## 2018-12-13 DIAGNOSIS — Z01419 Encounter for gynecological examination (general) (routine) without abnormal findings: Secondary | ICD-10-CM

## 2018-12-13 DIAGNOSIS — Z23 Encounter for immunization: Secondary | ICD-10-CM

## 2018-12-13 DIAGNOSIS — N6320 Unspecified lump in the left breast, unspecified quadrant: Secondary | ICD-10-CM | POA: Diagnosis not present

## 2018-12-13 MED ORDER — FLUOXETINE HCL 20 MG PO CAPS: 20.0000 mg | ORAL_CAPSULE | Freq: Every day | ORAL | 3 refills | Status: DC

## 2018-12-13 MED ORDER — LEVOTHYROXINE SODIUM 75 MCG PO TABS: ORAL_TABLET | ORAL | 3 refills | Status: DC

## 2018-12-13 NOTE — Telephone Encounter (Signed)
Order placed for bilateral Dx MMG with left breast US, if needed.   Call to [facility], spoke with Clarise Cruz. Screening MMG cancelled. Bilateral Dx MMG and [place] scheduled for 6/2 at 11am, arriving at 10:40am.

## 2018-12-13 NOTE — Progress Notes (Signed)
48 y.o. G46P2002 Married [place] female here for annual exam. Patient complains of having low libido. Per patient in the last 3-4 months has had increase in anxiety.    Two periods of bleeding.  She is noticing she is feeling more tense during her menstruation.   She saw specialist, rheumatologist and neurologist last year due to joint pain and migraine HA.  This decreased after she starting doing regular exercise.  She had normal TFTs.   Having a lot of stress.  She is taking Prozac.   Her family of origin is in [place]. Father had 2 strokes.  Mother at home alone.   PCP:  Vickie L. Henson, NP-C   Patient's last menstrual period was 11/12/2018 (within weeks).           Sexually active: Yes.    The current method of family planning is Mirena - inserted 09/17/15.    Exercising: Yes.    pilates and treadmill Smoker:  no  Health Maintenance: Pap:  09/09/17 Neg:Neg HR HPV History of abnormal Pap:  no MMG:  06/09/17 Bilateral Diagnostic/Right Breast US -- BIRADS 2 benign/density d.  She will do next week.  TDaP:  2010 Gardasil:   no HIV: negative in the past Hep C: never Screening Labs:  Discuss today if needed   reports that she has never smoked. She has never used smokeless tobacco. She reports that she does not drink alcohol or use drugs.  Past Medical History:  Diagnosis Date  . Anxiety   . Anxiety and depression 05/27/2018  . Depression   . Hypothyroidism   . Infertility, female   . Migraine headache with aura   . Sciatica   . Sleep disorder 05/27/2018  . Thyroid disease     Past Surgical History:  Procedure Laterality Date  . CESAREAN SECTION     2 times  . hemorrhectomy  09/2007   --in [place]  . LUMBAR LAMINECTOMY Right 10/01/2016   Procedure: RIGHT SIDEDE LUMBAR 5-SACRUM 1 MICRODISCECTOMY;  Surgeon: Phylliss Bob, MD;  Location: [facility];  Service: Orthopedics;  Laterality: Right;  RIGHT SIDEDE LUMBAR 5-SACRUM 1 MICRODISCECTOMY   . WISDOM TOOTH EXTRACTION       Current Outpatient Medications  Medication Sig Dispense Refill  . cholecalciferol (VITAMIN D) 1000 units tablet Take 1,000 Units by mouth daily.    Marland Kitchen FLUoxetine (PROZAC) 20 MG capsule Take 1 capsule (20 mg total) by mouth daily. 90 capsule 0  . fluticasone (FLONASE) 50 MCG/ACT nasal spray Place 1 spray into both nostrils as needed for allergies or rhinitis.    Marland Kitchen levonorgestrel (MIRENA) 20 MCG/24HR IUD 1 each by Intrauterine route once.    Marland Kitchen levothyroxine (SYNTHROID, LEVOTHROID) 75 MCG tablet TAKE ONE TABLET BY MOUTH DAILY BEFORE BREAKFAST 90 tablet 0  . MAGNESIUM CITRATE PO Take 1 tablet by mouth daily.    . SUMAtriptan (IMITREX) 100 MG tablet Take 1 tablet earliest onset of migraine.  May repeat x1 in 2 hours if headache persists or recurs.  Do not exceed 2 tablets in 24h (Patient not taking: Reported on 12/13/2018) 10 tablet 3  . topiramate (TOPAMAX) 50 MG tablet Take 1 tablet (50 mg total) by mouth at bedtime. (Patient not taking: Reported on 12/13/2018) 30 tablet 3   No current facility-administered medications for this visit.     Family History  Problem Relation Age of Onset  . Diabetes Mother   . Hypertension Mother   . Thyroid disease Mother  hx thyroid nodule  . Hypertension Father   . Hyperlipidemia Father   . Stroke Father   . Thyroid disease Sister   . Diabetes Maternal Grandmother   . Diabetes Maternal Grandfather   . Diabetes Paternal Grandmother   . Diabetes Paternal Grandfather     Review of Systems  Constitutional: Negative.   HENT: Negative.   Eyes: Negative.   Respiratory: Negative.   Cardiovascular: Negative.   Gastrointestinal: Negative.   Endocrine: Negative.   Genitourinary:       Low libido   Musculoskeletal: Negative.   Skin: Negative.   Allergic/Immunologic: Negative.   Neurological: Negative.   Hematological: Negative.   Psychiatric/Behavioral: The patient is nervous/anxious.     Exam:   BP 126/78 (BP Location: Left Arm, Patient  Position: Sitting, Cuff Size: Normal)   Pulse 88   Temp 97.7 F (36.5 C) (Temporal)   Resp 14   Ht 5' 4.75" (1.645 m)   Wt 184 lb (83.5 kg)   LMP 11/12/2018 (Within Weeks)   BMI 30.86 kg/m     General appearance: alert, cooperative and appears stated age Head: normocephalic, without obvious abnormality, atraumatic Neck: no adenopathy, supple, symmetrical, trachea midline and thyroid normal to inspection and palpation Lungs: clear to auscultation bilaterally Breasts: right - normal appearance, no masses or tenderness, No nipple retraction or dimpling, No nipple discharge or bleeding, No axillary adenopathy Left - 1.5 cm mass at 10:00, No nipple retraction or dimpling, No nipple discharge or bleeding, No axillary adenopathy Heart: regular rate and rhythm Abdomen: soft, non-tender; no masses, no organomegaly Extremities: extremities normal, atraumatic, no cyanosis or edema Skin: skin color, texture, turgor normal. No rashes or lesions Lymph nodes: cervical, supraclavicular, and axillary nodes normal. Neurologic: grossly normal  Pelvic: External genitalia:  no lesions              No abnormal inguinal nodes palpated.              Urethra:  normal appearing urethra with no masses, tenderness or lesions              Bartholins and Skenes: normal                 Vagina: normal appearing vagina with normal color and discharge, no lesions              Cervix: no lesions.  IUD strings noted.               Pap taken: No. Bimanual Exam:  Uterus:  normal size, contour, position, consistency, mobility, non-tender              Adnexa: no mass, fullness, tenderness              Rectal exam: Yes.  .  Confirms.              Anus:  normal sphincter tone, no lesions  Chaperone was present for exam.  Assessment:   Well woman visit with normal exam. Mirena.  Decreased libido.  Life stress.  Left breast mass.  Plan: Mammogram bilateral dx and left breast US.  Self breast awareness  reviewed. Pap and HR HPV as above. Guidelines for Calcium, Vitamin D, regular exercise program including cardiovascular and weight bearing exercise. Refill of Prozac and Synthroid.  Lipid profile.  Referral to [facility] psychology.  TDap. Follow up annually and prn.    After visit summary provided.

## 2018-12-13 NOTE — Patient Instructions (Signed)

## 2018-12-13 NOTE — Telephone Encounter (Signed)
Please schedule dx bilateral mammogram and left breast US at the [facility].   Patient has a left breast mass, 1.5 cm at 10:00.   She is scheduled for a screening mammogram next week.

## 2018-12-13 NOTE — Telephone Encounter (Signed)
Call returned to patient, left detailed message, ok per dpr. Advised of appt details at [facility] as seen below. Screening MMG appt has been cancelled.  Advised if patient can not make appt or needs to make changes to the appt contact TBC directly at [phone]. Covid19 precautions provided. Return call to office if any additional questions.   Routing to provider for final review. Patient is agreeable to disposition. Will close encounter.

## 2018-12-14 ENCOUNTER — Ambulatory Visit
Admission: RE | Admit: 2018-12-14 | Discharge: 2018-12-14 | Disposition: A | Discharge status: Home / Self Care | Payer: 59 | Source: Ambulatory Visit | Attending: Obstetrics and Gynecology | Admitting: Obstetrics and Gynecology

## 2018-12-14 ENCOUNTER — Other Ambulatory Visit: Payer: Self-pay | Admitting: Obstetrics and Gynecology

## 2018-12-14 DIAGNOSIS — N6320 Unspecified lump in the left breast, unspecified quadrant: Secondary | ICD-10-CM

## 2018-12-14 LAB — LIPID PANEL
Chol/HDL Ratio: 3.7 ratio (ref 0.0–4.4)
Cholesterol, Total: 191 mg/dL (ref 100–199)
HDL: 51 mg/dL (ref >39)
LDL Calculated: 124 mg/dL — ABNORMAL HIGH (ref 0–99)
Triglycerides: 81 mg/dL (ref 0–149)
VLDL Cholesterol Cal: 16 mg/dL (ref 5–40)

## 2018-12-16 ENCOUNTER — Ambulatory Visit: Payer: 59

## 2018-12-17 ENCOUNTER — Ambulatory Visit
Admission: RE | Admit: 2018-12-17 | Discharge: 2018-12-17 | Disposition: A | Discharge status: Home / Self Care | Payer: 59 | Source: Ambulatory Visit | Attending: Obstetrics and Gynecology | Admitting: Obstetrics and Gynecology

## 2018-12-17 ENCOUNTER — Other Ambulatory Visit: Payer: Self-pay

## 2018-12-17 DIAGNOSIS — N6320 Unspecified lump in the left breast, unspecified quadrant: Secondary | ICD-10-CM

## 2018-12-27 ENCOUNTER — Encounter: Payer: Self-pay | Admitting: Obstetrics and Gynecology

## 2019-01-04 ENCOUNTER — Ambulatory Visit: Payer: 59

## 2019-01-09 NOTE — Telephone Encounter (Signed)
Encounter closed

## 2019-04-20 ENCOUNTER — Other Ambulatory Visit (INDEPENDENT_AMBULATORY_CARE_PROVIDER_SITE_OTHER): Payer: Managed Care, Other (non HMO)

## 2019-04-20 ENCOUNTER — Other Ambulatory Visit: Payer: Self-pay

## 2019-04-20 DIAGNOSIS — Z23 Encounter for immunization: Secondary | ICD-10-CM

## 2019-07-15 DIAGNOSIS — M5020 Other cervical disc displacement, unspecified cervical region: Secondary | ICD-10-CM

## 2019-07-15 HISTORY — DX: Other cervical disc displacement, unspecified cervical region: M50.20

## 2019-08-24 ENCOUNTER — Other Ambulatory Visit: Payer: 59

## 2019-08-24 ENCOUNTER — Other Ambulatory Visit: Payer: Self-pay

## 2019-08-24 ENCOUNTER — Ambulatory Visit: Payer: Managed Care, Other (non HMO) | Attending: Internal Medicine

## 2019-08-24 DIAGNOSIS — Z20822 Contact with and (suspected) exposure to covid-19: Secondary | ICD-10-CM | POA: Insufficient documentation

## 2019-08-25 LAB — NOVEL CORONAVIRUS, NAA: SARS-CoV-2, NAA: NOT DETECTED

## 2019-12-20 ENCOUNTER — Other Ambulatory Visit: Payer: Self-pay | Admitting: *Deleted

## 2019-12-20 MED ORDER — FLUOXETINE HCL 20 MG PO CAPS: 20.0000 mg | ORAL_CAPSULE | Freq: Every day | ORAL | 0 refills | Status: DC

## 2019-12-20 MED ORDER — LEVOTHYROXINE SODIUM 75 MCG PO TABS: ORAL_TABLET | ORAL | 0 refills | Status: DC

## 2019-12-20 NOTE — Telephone Encounter (Signed)
Medication refill request: Prozac Last AEX:  12-13-2018 BS Next AEX: message left for patient to call and schedule Last MMG (if hormonal medication request): n/a Refill authorized: Today, please advise.   Medication refill request: Synthroid  Refill authorized: Today, please advise.   Medications pended for #30, 0RF. Please refill if appropriate.

## 2019-12-21 ENCOUNTER — Telehealth: Payer: Self-pay

## 2019-12-21 NOTE — Telephone Encounter (Signed)
Patient is returning call regarding refill.

## 2019-12-23 ENCOUNTER — Ambulatory Visit: Payer: Managed Care, Other (non HMO) | Admitting: Obstetrics and Gynecology

## 2019-12-23 ENCOUNTER — Other Ambulatory Visit: Payer: Self-pay

## 2019-12-23 ENCOUNTER — Encounter: Payer: Self-pay | Admitting: Obstetrics and Gynecology

## 2019-12-23 ENCOUNTER — Telehealth: Payer: Self-pay

## 2019-12-23 VITALS — BP 114/70 | HR 76 | Temp 97.9°F | Ht 65.0 in | Wt 200.0 lb

## 2019-12-23 DIAGNOSIS — D242 Benign neoplasm of left breast: Secondary | ICD-10-CM | POA: Diagnosis not present

## 2019-12-23 DIAGNOSIS — N644 Mastodynia: Secondary | ICD-10-CM

## 2019-12-23 NOTE — Progress Notes (Signed)
GYNECOLOGY  VISIT   HPI: 49 y.o.   Married White or Caucasian Hispanic or Latino [place] female   [phone] with No LMP recorded. (Menstrual status: IUD).   here for right breast lump and tenderness. She noticed some discomfort yesterday in her right breast, feels a lump in that area.  She was diagnosed with a left breast fibroadenoma last year.  No monthly cycles with the [name], slight brown d/c 1-2 days ago.     GYNECOLOGIC HISTORY: No LMP recorded. (Menstrual status: IUD). Contraception:[name] Menopausal hormone therapy: none        OB History    Gravida  2   Para  2   Term  2   Preterm      AB      Living  2     SAB      TAB      Ectopic      Multiple      Live Births                 Patient Active Problem List   Diagnosis Date Noted  . Anxiety and depression 05/27/2018  . Sleep disorder 05/27/2018  . Chronic daily headache 05/27/2018  . History of restless legs syndrome 05/27/2018  . History of rheumatoid arthritis 05/27/2018  . Family history of diabetes mellitus 05/27/2018  . Arthralgia 05/27/2018  . Fluid retention 05/27/2018  . Hypothyroidism due to Hashimoto's thyroiditis 05/27/2018    Past Medical History:  Diagnosis Date  . Anxiety   . Anxiety and depression 05/27/2018  . Depression   . Hypothyroidism   . Infertility, female   . Migraine headache with aura   . Sciatica   . Sleep disorder 05/27/2018  . Thyroid disease     Past Surgical History:  Procedure Laterality Date  . BREAST BIOPSY Left 2020   benign fibroadenoma  . CESAREAN SECTION     2 times  . hemorrhectomy  09/2007   --in [place]  . LUMBAR LAMINECTOMY Right 10/01/2016   Procedure: RIGHT SIDEDE LUMBAR 5-SACRUM 1 MICRODISCECTOMY;  Surgeon: Phylliss Bob, MD;  Location: [facility];  Service: Orthopedics;  Laterality: Right;  RIGHT SIDEDE LUMBAR 5-SACRUM 1 MICRODISCECTOMY   . WISDOM TOOTH EXTRACTION      Current Outpatient Medications  Medication Sig Dispense Refill   . BIOTIN PO Take by mouth daily.    . cholecalciferol (VITAMIN D) 1000 units tablet Take 1,000 Units by mouth daily.    Marland Kitchen FLUoxetine (PROZAC) 20 MG capsule Take 1 capsule (20 mg total) by mouth daily. 30 capsule 0  . fluticasone (FLONASE) 50 MCG/ACT nasal spray Place 1 spray into both nostrils as needed for allergies or rhinitis.    Marland Kitchen levonorgestrel (MIRENA) 20 MCG/24HR IUD 1 each by Intrauterine route once.    Marland Kitchen levothyroxine (SYNTHROID) 75 MCG tablet TAKE ONE TABLET BY MOUTH DAILY BEFORE BREAKFAST 30 tablet 0  . MAGNESIUM CITRATE PO Take 1 tablet by mouth daily.     No current facility-administered medications for this visit.     ALLERGIES: Peanuts [peanut oil]  Family History  Problem Relation Age of Onset  . Diabetes Mother   . Hypertension Mother   . Thyroid disease Mother        hx thyroid nodule  . Hypertension Father   . Hyperlipidemia Father   . Stroke Father   . Thyroid disease Sister   . Diabetes Maternal Grandmother   . Diabetes Maternal Grandfather   . Diabetes Paternal  Grandmother   . Diabetes Paternal Grandfather     Social History   Socioeconomic History  . Marital status: Married    Spouse name: Romie Minus  . Number of children: 2  . Years of education: Not on file  . Highest education level: Master's degree (e.g., MA, MS, MEng, MEd, MSW, MBA)  Occupational History  . Not on file  Tobacco Use  . Smoking status: Never Smoker  . Smokeless tobacco: Never Used  Vaping Use  . Vaping Use: Never used  Substance and Sexual Activity  . Alcohol use: No    Alcohol/week: 0.0 standard drinks  . Drug use: No  . Sexual activity: Yes    Partners: Male    Birth control/protection: I.U.D.    Comment: [name] inserted 09-17-15  Other Topics Concern  . Not on file  Social History Narrative   Patient is right-handed. She lives with her husband and 2 daughters. 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 She has been in the [place]  for 10 years. She avoids caffeine. She goes to the gym for Pilates 2 days a week.   Social Determinants of Health   Financial Resource Strain:   . Difficulty of Paying Living Expenses:   Food Insecurity:   . Worried About Charity fundraiser in the Last Year:   . Arboriculturist in the Last Year:   Transportation Needs:   . Film/video editor (Medical):   Marland Kitchen Lack of Transportation (Non-Medical):   Physical Activity:   . Days of Exercise per Week:   . Minutes of Exercise per Session:   Stress:   . Feeling of Stress :   Social Connections:   . Frequency of Communication with Friends and Family:   . Frequency of Social Gatherings with Friends and Family:   . Attends Religious Services:   . Active Member of Clubs or Organizations:   . Attends Archivist Meetings:   Marland Kitchen Marital Status:   Intimate Partner Violence:   . Fear of Current or Ex-Partner:   . Emotionally Abused:   Marland Kitchen Physically Abused:   . Sexually Abused:     Review of Systems  Constitutional:       Breast lump Breast tenderness  HENT: Negative.   Eyes: Negative.   Respiratory: Negative.   Cardiovascular: Negative.   Gastrointestinal: Negative.   Genitourinary: Negative.   Musculoskeletal: Negative.   Skin: Negative.   Neurological: Negative.   Endo/Heme/Allergies: Negative.   Psychiatric/Behavioral: Negative.     PHYSICAL EXAMINATION:    BP 114/70 (BP Location: Right Arm, Patient Position: Sitting, Cuff Size: Normal)   Pulse 76   Temp 97.9 F (36.6 C) (Temporal)   Ht 5\' 5"  (1.651 m)   Wt 200 lb (90.7 kg)   BMI 33.28 kg/m     General appearance: alert, cooperative and appears stated age Breasts: in the left breast at 10-11 o'clock, just outside the areolar region is a 1.5 cm mobile lump, known fibroadenoma (stable). No lumps felt in the right breast. Bilateral breast tenderness in the upper outer quadrants (worse in the right breast). The patient was examined supine and sitting.  No axillary  or supraclavicular adenopathy.    ASSESSMENT Right breast pain x 1 day, bilateral tenderness.  Left fibroadenoma stable    PLAN Recommended she return to see Dr Quincy Simmonds in 2 weeks for a repeat breast exam. If symptoms persist will get diagnostic breast imaging.  If not she needs screening mammogram Avoid caffeine (already does) Can use ice, tylenol Make sure her bra fits well.    An After Visit Summary was printed and given to the patient.  ~24 minutes in total patient care.   CC: Dr Quincy Simmonds

## 2019-12-23 NOTE — Telephone Encounter (Signed)
Patient is calling in regards to finding lump in right breast.

## 2019-12-23 NOTE — Patient Instructions (Signed)

## 2019-12-23 NOTE — Telephone Encounter (Signed)
AEX 12/2018 Next AEX 07/2020, on waiting list.  H/o left breast mass 12/2018, Birads 4, had Bx Pathology revealed FIBROADENOMA of the LEFT breast, upper inner, 10 o'clock position  Spoke with pt. Pt states finding a right breast lump in upper chest x 2 days. Pt states is tender and sore. Denies breast changes to skin or size, nipple discharge, fever, chills, redness to area.   Advised to have OV for further evaluation. Pt agreeable. Pt aware Dr Quincy Simmonds not in office today. Ok to see another provider. Pt scheduled as work in appt with Dr Talbert Nan at 945 am. Pt agreeable and verbalized understanding.   Routing to Dr Talbert Nan for review.  Encounter closed.

## 2019-12-27 NOTE — Telephone Encounter (Signed)
Attempted to reach patient. Error message stating, "call rejected."

## 2020-01-09 ENCOUNTER — Ambulatory Visit: Payer: Managed Care, Other (non HMO) | Admitting: Obstetrics and Gynecology

## 2020-01-18 ENCOUNTER — Telehealth: Payer: Self-pay | Admitting: Obstetrics and Gynecology

## 2020-01-18 ENCOUNTER — Other Ambulatory Visit: Payer: Self-pay

## 2020-01-18 ENCOUNTER — Encounter: Payer: Self-pay | Admitting: Obstetrics and Gynecology

## 2020-01-18 ENCOUNTER — Other Ambulatory Visit: Payer: Self-pay | Admitting: Obstetrics and Gynecology

## 2020-01-18 ENCOUNTER — Ambulatory Visit: Payer: Managed Care, Other (non HMO) | Admitting: Obstetrics and Gynecology

## 2020-01-18 VITALS — BP 110/76 | HR 70 | Ht 65.0 in | Wt 203.0 lb

## 2020-01-18 DIAGNOSIS — D242 Benign neoplasm of left breast: Secondary | ICD-10-CM

## 2020-01-18 DIAGNOSIS — N644 Mastodynia: Secondary | ICD-10-CM | POA: Diagnosis not present

## 2020-01-18 DIAGNOSIS — E039 Hypothyroidism, unspecified: Secondary | ICD-10-CM | POA: Diagnosis not present

## 2020-01-18 MED ORDER — FLUOXETINE HCL 20 MG PO CAPS: 20.0000 mg | ORAL_CAPSULE | Freq: Every day | ORAL | 5 refills | Status: DC

## 2020-01-18 MED ORDER — LEVOTHYROXINE SODIUM 75 MCG PO TABS: ORAL_TABLET | ORAL | 5 refills | Status: DC

## 2020-01-18 NOTE — Progress Notes (Signed)
GYNECOLOGY  VISIT   HPI: 49 y.o.   Married  Caucasian  female   G2P2002 with No LMP recorded. (Menstrual status: IUD).   here for breast recheck.  Seen by Dr. Talbert Nan on 12/23/19 for right breast lump and tenderness.  Hx left breast fibroadenoma, palpated at a 1.5 cm mass at 10 to 11:00 on the day of her visit.  This is biopsy proven from 12/17/18.  She is interested in potential removal.   Patient did have some vaginal bleeding soon after she noted the tenderness and lump.  She continues to have breast tenderness bilaterally that comes and goes, and it all started about 3 - 4 months ago. She feels a heaviness in the breasts.   Doing therapy per Internet with therapist in [place].  Needs a refill of Synthroid and Prozac.  Doing well on her Prozac.  Received her Covid vaccine, and her family received it also. Her mother had Covid in [place] and survived.  She had received her first of 2 vaccines.   GYNECOLOGIC HISTORY: No LMP recorded. (Menstrual status: IUD). Contraception:  [name] 09-17-15 Menopausal hormone therapy:  none Last mammogram: 12-14-18 See Epic Last pap smear:  09/09/17 Neg:Neg HR HPV, 03-01-14 Neg:Neg HR HPV        OB History    Gravida  2   Para  2   Term  2   Preterm      AB      Living  2     SAB      TAB      Ectopic      Multiple      Live Births                 Patient Active Problem List   Diagnosis Date Noted  . Anxiety and depression 05/27/2018  . Sleep disorder 05/27/2018  . Chronic daily headache 05/27/2018  . History of restless legs syndrome 05/27/2018  . History of rheumatoid arthritis 05/27/2018  . Family history of diabetes mellitus 05/27/2018  . Arthralgia 05/27/2018  . Fluid retention 05/27/2018  . Hypothyroidism due to Hashimoto's thyroiditis 05/27/2018    Past Medical History:  Diagnosis Date  . Anxiety   . Anxiety and depression 05/27/2018  . Depression   . Hypothyroidism   . Infertility, female   . Migraine  headache with aura   . Sciatica   . Sleep disorder 05/27/2018  . Thyroid disease     Past Surgical History:  Procedure Laterality Date  . BREAST BIOPSY Left 2020   benign fibroadenoma  . CESAREAN SECTION     2 times  . hemorrhectomy  09/2007   --in [place]  . LUMBAR LAMINECTOMY Right 10/01/2016   Procedure: RIGHT SIDEDE LUMBAR 5-SACRUM 1 MICRODISCECTOMY;  Surgeon: Phylliss Bob, MD;  Location: [facility];  Service: Orthopedics;  Laterality: Right;  RIGHT SIDEDE LUMBAR 5-SACRUM 1 MICRODISCECTOMY   . WISDOM TOOTH EXTRACTION      Current Outpatient Medications  Medication Sig Dispense Refill  . BIOTIN PO Take by mouth daily.    . cholecalciferol (VITAMIN D) 1000 units tablet Take 1,000 Units by mouth daily.    Marland Kitchen FLUoxetine (PROZAC) 20 MG capsule Take 1 capsule (20 mg total) by mouth daily. 30 capsule 0  . fluticasone (FLONASE) 50 MCG/ACT nasal spray Place 1 spray into both nostrils as needed for allergies or rhinitis.    Marland Kitchen levonorgestrel (MIRENA) 20 MCG/24HR IUD 1 each by Intrauterine route once.    Marland Kitchen  levothyroxine (SYNTHROID) 75 MCG tablet TAKE ONE TABLET BY MOUTH DAILY BEFORE BREAKFAST 30 tablet 0  . MAGNESIUM CITRATE PO Take 1 tablet by mouth daily.     No current facility-administered medications for this visit.     ALLERGIES: Peanuts [peanut oil]  Family History  Problem Relation Age of Onset  . Diabetes Mother   . Hypertension Mother   . Thyroid disease Mother        hx thyroid nodule  . Hypertension Father   . Hyperlipidemia Father   . Stroke Father   . Thyroid disease Sister   . Diabetes Maternal Grandmother   . Diabetes Maternal Grandfather   . Diabetes Paternal Grandmother   . Diabetes Paternal Grandfather     Social History   Socioeconomic History  . Marital status: Married    Spouse name: Romie Minus  . Number of children: 2  . Years of education: Not on file  . Highest education level: Master's degree (e.g., MA, MS, MEng, MEd, MSW, MBA)  Occupational History  .  Not on file  Tobacco Use  . Smoking status: Never Smoker  . Smokeless tobacco: Never Used  Vaping Use  . Vaping Use: Never used  Substance and Sexual Activity  . Alcohol use: No    Alcohol/week: 0.0 standard drinks  . Drug use: No  . Sexual activity: Yes    Partners: Male    Birth control/protection: I.U.D.    Comment: [name] inserted 09-17-15  Other Topics Concern  . Not on file  Social History Narrative   Patient is right-handed. She lives with her husband and 2 daughters. 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 She avoids caffeine. She goes to the gym for Pilates 2 days a week.   Social Determinants of Health   Financial Resource Strain:   . Difficulty of Paying Living Expenses:   Food Insecurity:   . Worried About Charity fundraiser in the Last Year:   . Arboriculturist in the Last Year:   Transportation Needs:   . Film/video editor (Medical):   Marland Kitchen Lack of Transportation (Non-Medical):   Physical Activity:   . Days of Exercise per Week:   . Minutes of Exercise per Session:   Stress:   . Feeling of Stress :   Social Connections:   . Frequency of Communication with Friends and Family:   . Frequency of Social Gatherings with Friends and Family:   . Attends Religious Services:   . Active Member of Clubs or Organizations:   . Attends Archivist Meetings:   Marland Kitchen Marital Status:   Intimate Partner Violence:   . Fear of Current or Ex-Partner:   . Emotionally Abused:   Marland Kitchen Physically Abused:   . Sexually Abused:     Review of Systems  All other systems reviewed and are negative.   PHYSICAL EXAMINATION:    BP 110/76 (Cuff Size: Large)   Pulse 70   Ht 5\' 5"  (1.651 m)   Wt 203 lb (92.1 kg)   BMI 33.78 kg/m     General appearance: alert, cooperative and appears stated age   Breasts: right - normal appearance, no masses or tenderness, No nipple retraction or dimpling, No  nipple discharge or bleeding, No axillary adenopathy Left - normal appearance, 3 cm mass at 10:00 to 11:00.  No tenderness.  No nipple retraction or dimpling, No  nipple discharge or bleeding, No axillary adenopathy  Chaperone was present for exam.  ASSESSMENT  Mastalgia.  Bilateral.  Left breast mass.   Known Fibroadenoma.  May be increasing in size.  Hypothyroidism.   PLAN  Will proceed with bilateral dx mammogram and left breast US.  She would like to see a general surgeon about removal of her breast fibroadenoma.  Check TFTs today.  Refill of Synthroid and Prozac until annual exam in January, 2022. FU for annual exam and prn.   __33____ minutes consultation.

## 2020-01-18 NOTE — Telephone Encounter (Signed)
Please contact patient to schedule [facility] bilateral [facility]  She has bilateral breast pain and a known left fibroma of the breast at 10:00 - 11:00.  The mass feels like it may be enlarging.   The patient will then have referral to a general surgeon after this evaluation is complete.   Please place in mammogram hold.

## 2020-01-18 NOTE — Telephone Encounter (Signed)
Spoke with Rachel Wiggins at [facility]. Pt scheduled for 02/06/20 at 11 am.   Spoke with pt. Pt given scheduled appt at [facility]. Pt agreeable and verbalized understanding.   Routing to Dr Quincy Simmonds for review.  Encounter closed.  Orders placed by TBC for co-sign.

## 2020-01-19 LAB — T4, FREE: Free T4: 1.04 ng/dL (ref 0.82–1.77)

## 2020-01-19 LAB — TSH: TSH: 1.01 u[IU]/mL (ref 0.450–4.500)

## 2020-02-06 ENCOUNTER — Ambulatory Visit
Admission: RE | Admit: 2020-02-06 | Discharge: 2020-02-06 | Disposition: A | Discharge status: Home / Self Care | Payer: Managed Care, Other (non HMO) | Source: Ambulatory Visit | Attending: Obstetrics and Gynecology | Admitting: Obstetrics and Gynecology

## 2020-02-06 ENCOUNTER — Other Ambulatory Visit: Payer: Self-pay

## 2020-02-06 DIAGNOSIS — D242 Benign neoplasm of left breast: Secondary | ICD-10-CM

## 2020-02-16 NOTE — Progress Notes (Signed)
49 y.o. G80P2002 Married [place] female here for annual exam.    Hx left breast fibroadenoma on biopsy.   She has spotting at times.  Does report some left sided lower abdominal pain that can last for a week at a time.  She associates with a potential cycle and it is cyclic in nature. It does resolved.  Has hot flashes.   Wants to increase her Prozac 20 mg.  She is having panic attacks.   May move to [place] for her husband's work.  Daughter moving to [place].   PCP:   Harland Dingwall, NP  No LMP recorded. (Menstrual status: IUD).           Sexually active: Yes.    The current method of family planning is IUD--Mirena 09-17-15.    Exercising: Yes.    pilates 3x/week Smoker:  no  Health Maintenance: Pap: 09/09/17 Neg:Neg HR HPV, 03-01-14 Neg:Neg HR HPV History of abnormal Pap:  no MMG: 02-06-20 Diag.Bil.w/Lt.US--Biopsy-proven fibroadenoma in the LEFT breast, slightly increased in sized. No evidence of malignancy either breast/density D/BiRads2/screening 1 year  Colonoscopy:  no BMD:   n/a  Result  n/a TDaP:  12-13-18 Gardasil:   no HIV:Neg in the past Hep C:never Screening Labs:  Today.   reports that she has never smoked. She has never used smokeless tobacco. She reports current alcohol use of about 1.0 standard drink of alcohol per week. She reports that she does not use drugs.  Past Medical History:  Diagnosis Date  . Anxiety   . Anxiety and depression 05/27/2018  . Depression   . Herniated cervical disc 2021   C5  . Hypothyroidism   . Infertility, female   . Migraine headache with aura   . Sciatica   . Sleep disorder 05/27/2018  . Thyroid disease     Past Surgical History:  Procedure Laterality Date  . BREAST BIOPSY Left 2020   benign fibroadenoma  . CESAREAN SECTION     2 times  . hemorrhectomy  09/2007   --in [place]  . LUMBAR LAMINECTOMY Right 10/01/2016   Procedure: RIGHT SIDEDE LUMBAR 5-SACRUM 1 MICRODISCECTOMY;  Surgeon: Phylliss Bob, MD;  Location:  [facility];  Service: Orthopedics;  Laterality: Right;  RIGHT SIDEDE LUMBAR 5-SACRUM 1 MICRODISCECTOMY   . WISDOM TOOTH EXTRACTION      Current Outpatient Medications  Medication Sig Dispense Refill  . BIOTIN PO Take by mouth daily.    . cholecalciferol (VITAMIN D) 1000 units tablet Take 1,000 Units by mouth daily.    Marland Kitchen FLUoxetine (PROZAC) 20 MG capsule Take 1 capsule (20 mg total) by mouth daily. 30 capsule 5  . fluticasone (FLONASE) 50 MCG/ACT nasal spray Place 1 spray into both nostrils as needed for allergies or rhinitis.    Marland Kitchen levonorgestrel (MIRENA) 20 MCG/24HR IUD 1 each by Intrauterine route once.    Marland Kitchen levothyroxine (SYNTHROID) 75 MCG tablet TAKE ONE TABLET BY MOUTH DAILY BEFORE BREAKFAST 30 tablet 5  . MAGNESIUM CITRATE PO Take 1 tablet by mouth daily.     No current facility-administered medications for this visit.    Family History  Problem Relation Age of Onset  . Diabetes Mother   . Hypertension Mother   . Thyroid disease Mother        hx thyroid nodule  . Hypertension Father   . Hyperlipidemia Father   . Stroke Father   . Thyroid disease Sister   . Diabetes Maternal Grandmother   . Diabetes Maternal Grandfather   .  Diabetes Paternal Grandmother   . Diabetes Paternal Grandfather     Review of Systems  All other systems reviewed and are negative.   Exam:   BP 114/74 (Cuff Size: Large)   Pulse 78   Resp 16   Ht 5\' 5"  (1.651 m)   Wt 206 lb (93.4 kg)   BMI 34.28 kg/m     General appearance: alert, cooperative and appears stated age Head: normocephalic, without obvious abnormality, atraumatic Neck: no adenopathy, supple, symmetrical, trachea midline and thyroid normal to inspection and palpation Lungs: clear to auscultation bilaterally Breasts: right - normal appearance, no masses or tenderness, No nipple retraction or dimpling, No nipple discharge or bleeding, No axillary adenopathy Left - 2 cm mass at 10:00.   No nipple retraction or dimpling, No nipple discharge  or bleeding, No axillary adenopathy Heart: regular rate and rhythm Abdomen: soft, non-tender; no masses, no organomegaly Extremities: extremities normal, atraumatic, no cyanosis or edema Skin: skin color, texture, turgor normal. No rashes or lesions Lymph nodes: cervical, supraclavicular, and axillary nodes normal. Neurologic: grossly normal  Pelvic: External genitalia:  no lesions              No abnormal inguinal nodes palpated.              Urethra:  normal appearing urethra with no masses, tenderness or lesions              Bartholins and Skenes: normal                 Vagina: normal appearing vagina with normal color and discharge, no lesions              Cervix: no lesions.  IUD string noted.               Pap taken: No. Bimanual Exam:  Uterus:  normal size, contour, position, consistency, mobility, non-tender              Adnexa: no mass, fullness, tenderness              Rectal exam: Yes.  .  Confirms.              Anus:  normal sphincter tone, no lesions  Chaperone was present for exam.  Assessment:   Well woman visit with gynecologic exam.  [name].  Left breast mass.   Known Fibroadenoma on biopsy.  Hypothyroidism.  Life stress.   Plan: Mammogram screening discussed. Self breast awareness reviewed. Pap and HR HPV as above. Guidelines for Calcium, Vitamin D, regular exercise program including cardiovascular and weight bearing exercise. Labs today including routine labs and [place] and E2.  Increase Prozac to 30 mg daily.  Continue Synthroid 75 mcg daily. Follow up annually and prn.   After visit summary provided.

## 2020-02-20 ENCOUNTER — Encounter: Payer: Self-pay | Admitting: Obstetrics and Gynecology

## 2020-02-20 ENCOUNTER — Other Ambulatory Visit: Payer: Self-pay

## 2020-02-20 ENCOUNTER — Ambulatory Visit: Payer: Managed Care, Other (non HMO) | Admitting: Obstetrics and Gynecology

## 2020-02-20 VITALS — BP 114/74 | HR 78 | Resp 16 | Ht 65.0 in | Wt 206.0 lb

## 2020-02-20 DIAGNOSIS — N951 Menopausal and female climacteric states: Secondary | ICD-10-CM

## 2020-02-20 DIAGNOSIS — Z01419 Encounter for gynecological examination (general) (routine) without abnormal findings: Secondary | ICD-10-CM

## 2020-02-20 MED ORDER — FLUOXETINE HCL 10 MG PO CAPS: ORAL_CAPSULE | ORAL | 3 refills | Status: AC

## 2020-02-20 MED ORDER — LEVOTHYROXINE SODIUM 75 MCG PO TABS: ORAL_TABLET | ORAL | 3 refills | Status: AC

## 2020-02-20 NOTE — Patient Instructions (Signed)

## 2020-02-21 LAB — COMPREHENSIVE METABOLIC PANEL
ALT: 8 IU/L (ref 0–32)
AST: 14 IU/L (ref 0–40)
Albumin/Globulin Ratio: 1.4 (ref 1.2–2.2)
Albumin: 4.3 g/dL (ref 3.8–4.8)
Alkaline Phosphatase: 91 IU/L (ref 48–121)
BUN/Creatinine Ratio: 21 (ref 9–23)
BUN: 12 mg/dL (ref 6–24)
Bilirubin Total: 0.2 mg/dL (ref 0.0–1.2)
CO2: 26 mmol/L (ref 20–29)
Calcium: 8.8 mg/dL (ref 8.7–10.2)
Chloride: 100 mmol/L (ref 96–106)
Creatinine, Ser: 0.58 mg/dL (ref 0.57–1.00)
GFR calc Af Amer: 126 mL/min/{1.73_m2} (ref >59)
GFR calc non Af Amer: 109 mL/min/{1.73_m2} (ref >59)
Globulin, Total: 3 g/dL (ref 1.5–4.5)
Glucose: 86 mg/dL (ref 65–99)
Potassium: 4 mmol/L (ref 3.5–5.2)
Sodium: 137 mmol/L (ref 134–144)
Total Protein: 7.3 g/dL (ref 6.0–8.5)

## 2020-02-21 LAB — LIPID PANEL
Chol/HDL Ratio: 4.8 ratio — ABNORMAL HIGH (ref 0.0–4.4)
Cholesterol, Total: 221 mg/dL — ABNORMAL HIGH (ref 100–199)
HDL: 46 mg/dL (ref >39)
LDL Chol Calc (NIH): 129 mg/dL — ABNORMAL HIGH (ref 0–99)
Triglycerides: 258 mg/dL — ABNORMAL HIGH (ref 0–149)
VLDL Cholesterol Cal: 46 mg/dL — ABNORMAL HIGH (ref 5–40)

## 2020-02-21 LAB — CBC
Hematocrit: 39 % (ref 34.0–46.6)
Hemoglobin: 13.1 g/dL (ref 11.1–15.9)
MCH: 31.7 pg (ref 26.6–33.0)
MCHC: 33.6 g/dL (ref 31.5–35.7)
MCV: 94 fL (ref 79–97)
Platelets: 233 10*3/uL (ref 150–450)
RBC: 4.13 x10E6/uL (ref 3.77–5.28)
RDW: 12.4 % (ref 11.7–15.4)
WBC: 5.5 10*3/uL (ref 3.4–10.8)

## 2020-02-21 LAB — ESTRADIOL: Estradiol: 30.4 pg/mL

## 2020-02-21 LAB — FOLLICLE STIMULATING HORMONE: FSH: 7.9 m[IU]/mL

## 2020-02-24 ENCOUNTER — Encounter: Payer: Self-pay | Admitting: Obstetrics and Gynecology

## 2020-05-01 ENCOUNTER — Other Ambulatory Visit (INDEPENDENT_AMBULATORY_CARE_PROVIDER_SITE_OTHER): Payer: Managed Care, Other (non HMO)

## 2020-05-01 ENCOUNTER — Telehealth: Payer: Self-pay | Admitting: Internal Medicine

## 2020-05-01 ENCOUNTER — Other Ambulatory Visit: Payer: Self-pay

## 2020-05-01 DIAGNOSIS — Z23 Encounter for immunization: Secondary | ICD-10-CM

## 2020-05-01 NOTE — Telephone Encounter (Signed)
It looks like I have not seen her in almost 2 years. I am not up to date with her current health situation.   I recommend she ask her rheumatologist their opinion on this.

## 2020-05-01 NOTE — Telephone Encounter (Signed)
Pt was notified.  

## 2020-05-01 NOTE — Telephone Encounter (Signed)
Pt came in today for flu shot and wants to know when can she get her 3rd booster. She has RA. She wants to also know how long she would have to wait since she got flu shot today

## 2020-08-08 ENCOUNTER — Ambulatory Visit: Payer: Managed Care, Other (non HMO) | Admitting: Obstetrics and Gynecology

## 2020-12-05 NOTE — Progress Notes (Signed)
'GYNECOLOGY  VISIT   HPI: 50 y.o.   Married  [place]  female   [phone] with No LMP recorded. (Menstrual status: IUD).   here for irregular bleeding with [name] for months.  Bleeding is light and irregular.  Bleeding can also be heavy. Sometimes has cramping.  Breast pain can also occur.   Interested to change her [name].   Normal pelvic ultrasound and sonohysterogram 06/14/15.   Some bilateral anterior chest pain with intercourse.  Passes quickly.  No chest pain with exertion.   Has a herniated disc in her neck.   Moving to [place].  New insurance starts June 1.   GYNECOLOGIC HISTORY: No LMP recorded. (Menstrual status: IUD). Contraception:  [name] 09-17-15 Menopausal hormone therapy: none Last mammogram: 02-06-20 Diag.Bil.w/Lt.US--Biopsy-proven fibroadenoma in the LEFT breast, slightly increased in sized. No evidence of malignancy either breast/density D/BiRads2/screening 1 year  Last pap smear:  09/09/17 Neg:Neg HR HPV, 03-01-14 Neg:Neg HR HPV        OB History    Gravida  2   Para  2   Term  2   Preterm      AB      Living  2     SAB      IAB      Ectopic      Multiple      Live Births                 Patient Active Problem List   Diagnosis Date Noted  . Anxiety and depression 05/27/2018  . Sleep disorder 05/27/2018  . Chronic daily headache 05/27/2018  . History of restless legs syndrome 05/27/2018  . History of rheumatoid arthritis 05/27/2018  . Family history of diabetes mellitus 05/27/2018  . Arthralgia 05/27/2018  . Fluid retention 05/27/2018  . Hypothyroidism due to Hashimoto's thyroiditis 05/27/2018    Past Medical History:  Diagnosis Date  . Anxiety   . Anxiety and depression 05/27/2018  . Depression   . Herniated cervical disc 2021   C5  . Hyperlipidemia   . Hypothyroidism   . Infertility, female   . Migraine headache with aura   . Sciatica   . Sleep disorder 05/27/2018  . Thyroid disease     Past Surgical  History:  Procedure Laterality Date  . BREAST BIOPSY Left 2020   benign fibroadenoma  . CESAREAN SECTION     2 times  . hemorrhectomy  09/2007   --in [place]  . LUMBAR LAMINECTOMY Right 10/01/2016   Procedure: RIGHT SIDEDE LUMBAR 5-SACRUM 1 MICRODISCECTOMY;  Surgeon: Phylliss Bob, MD;  Location: [facility];  Service: Orthopedics;  Laterality: Right;  RIGHT SIDEDE LUMBAR 5-SACRUM 1 MICRODISCECTOMY   . WISDOM TOOTH EXTRACTION      Current Outpatient Medications  Medication Sig Dispense Refill  . BIOTIN PO Take by mouth daily.    . cholecalciferol (VITAMIN D) 1000 units tablet Take 1,000 Units by mouth daily.    Marland Kitchen FLUoxetine (PROZAC) 10 MG capsule Take 3 capsules (30 mg) orally per day. 270 capsule 3  . fluticasone (FLONASE) 50 MCG/ACT nasal spray Place 1 spray into both nostrils as needed for allergies or rhinitis.    Marland Kitchen levonorgestrel (MIRENA) 20 MCG/24HR IUD 1 each by Intrauterine route once.    Marland Kitchen levothyroxine (SYNTHROID) 75 MCG tablet TAKE ONE TABLET BY MOUTH DAILY BEFORE BREAKFAST 90 tablet 3  . magnesium 30 MG tablet Take 30 mg by mouth daily.     No current facility-administered medications  for this visit.     ALLERGIES: Peanuts [peanut oil]  Family History  Problem Relation Age of Onset  . Diabetes Mother   . Hypertension Mother   . Thyroid disease Mother        hx thyroid nodule  . Hypertension Father   . Hyperlipidemia Father   . Stroke Father   . Thyroid disease Sister   . Diabetes Maternal Grandmother   . Diabetes Maternal Grandfather   . Diabetes Paternal Grandmother   . Diabetes Paternal Grandfather     Social History   Socioeconomic History  . Marital status: Married    Spouse name: Romie Minus  . Number of children: 2  . Years of education: Not on file  . Highest education level: Master's degree (e.g., MA, MS, MEng, MEd, MSW, MBA)  Occupational History  . Not on file  Tobacco Use  . Smoking status: Never Smoker  . Smokeless tobacco: Never Used  Vaping Use  .  Vaping Use: Never used  Substance and Sexual Activity  . Alcohol use: Yes    Alcohol/week: 1.0 standard drink    Types: 1 Cans of beer per week  . Drug use: No  . Sexual activity: Yes    Partners: Male    Birth control/protection: I.U.D.    Comment: [name] inserted 09-17-15  Other Topics Concern  . Not on file  Social History Narrative   Patient is right-handed. She lives with her husband and 2 daughters. 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 She avoids caffeine. She goes to the gym for Pilates 2 days a week.   Social Determinants of Health   Financial Resource Strain: Not on file  Food Insecurity: Not on file  Transportation Needs: Not on file  Physical Activity: Not on file  Stress: Not on file  Social Connections: Not on file  Intimate Partner Violence: Not on file    Review of Systems  All other systems reviewed and are negative.   PHYSICAL EXAMINATION:    BP 120/82 (Cuff Size: Large)   Pulse 90   Ht 5\' 5"  (1.651 m)   Wt 202 lb (91.6 kg)   SpO2 99%   BMI 33.61 kg/m     General appearance: alert, cooperative and appears stated age   Pelvic: External genitalia:  no lesions              Urethra:  normal appearing urethra with no masses, tenderness or lesions              Bartholins and Skenes: normal                 Vagina: normal appearing vagina with normal color and discharge, no lesions              Cervix: no lesions.  Tiny amount of blood in vagina.  IUD strings noted.                Bimanual Exam:  Uterus:  normal size, contour, position, consistency, mobility, non-tender              Adnexa: no mass, fullness, tenderness         Chaperone was present for exam.  ASSESSMENT  Irregular menses.  I suspect this is related to her IUD age. [name] check up.   Chest pain, uncertain etiology.   PLAN  UPT now.  Return  for [name] exchange.   28 min  total time was  spent for this patient encounter, including preparation, face-to-face counseling with the patient, coordination of care, and documentation of the encounter.

## 2020-12-06 ENCOUNTER — Other Ambulatory Visit: Payer: Self-pay

## 2020-12-06 ENCOUNTER — Ambulatory Visit: Payer: Managed Care, Other (non HMO) | Admitting: Obstetrics and Gynecology

## 2020-12-06 ENCOUNTER — Encounter: Payer: Self-pay | Admitting: Obstetrics and Gynecology

## 2020-12-06 VITALS — BP 120/82 | HR 90 | Ht 65.0 in | Wt 202.0 lb

## 2020-12-06 DIAGNOSIS — Z30431 Encounter for routine checking of intrauterine contraceptive device: Secondary | ICD-10-CM

## 2020-12-06 DIAGNOSIS — N926 Irregular menstruation, unspecified: Secondary | ICD-10-CM | POA: Diagnosis not present

## 2020-12-06 LAB — PREGNANCY, URINE: Preg Test, Ur: NEGATIVE

## 2020-12-27 NOTE — Progress Notes (Deleted)
GYNECOLOGY  VISIT   HPI: 50 y.o.   Married  [place]  female   [phone] with No LMP recorded. (Menstrual status: IUD).   here for [name] exchange   GYNECOLOGIC HISTORY: No LMP recorded. (Menstrual status: IUD). Contraception:  [name] 09-17-15 Menopausal hormone therapy:  none Last mammogram:  02-06-20 Diag.Bil.w/Lt.US--Biopsy-proven fibroadenoma in the LEFT breast, slightly increased in sized. No evidence of malignancy either breast/density D/BiRads2/screening 1 year  Last pap smear: 09/09/17 Neg:Neg HR HPV, 03-01-14 Neg:Neg HR HPV        OB History     Gravida  2   Para  2   Term  2   Preterm      AB      Living  2      SAB      IAB      Ectopic      Multiple      Live Births                 Patient Active Problem List   Diagnosis Date Noted   Anxiety and depression 05/27/2018   Sleep disorder 05/27/2018   Chronic daily headache 05/27/2018   History of restless legs syndrome 05/27/2018   History of rheumatoid arthritis 05/27/2018   Family history of diabetes mellitus 05/27/2018   Arthralgia 05/27/2018   Fluid retention 05/27/2018   Hypothyroidism due to Hashimoto's thyroiditis 05/27/2018    Past Medical History:  Diagnosis Date   Anxiety    Anxiety and depression 05/27/2018   Depression    Herniated cervical disc 2021   C5   Hyperlipidemia    Hypothyroidism    Infertility, female    Migraine headache with aura    Sciatica    Sleep disorder 05/27/2018   Thyroid disease     Past Surgical History:  Procedure Laterality Date   BREAST BIOPSY Left 2020   benign fibroadenoma   CESAREAN SECTION     2 times   hemorrhectomy  09/2007   --in [place]   LUMBAR LAMINECTOMY Right 10/01/2016   Procedure: RIGHT SIDEDE LUMBAR 5-SACRUM 1 MICRODISCECTOMY;  Surgeon: Phylliss Bob, MD;  Location: [facility];  Service: Orthopedics;  Laterality: Right;  RIGHT SIDEDE LUMBAR 5-SACRUM 1 MICRODISCECTOMY    WISDOM TOOTH EXTRACTION      Current Outpatient  Medications  Medication Sig Dispense Refill   BIOTIN PO Take by mouth daily.     cholecalciferol (VITAMIN D) 1000 units tablet Take 1,000 Units by mouth daily.     FLUoxetine (PROZAC) 10 MG capsule Take 3 capsules (30 mg) orally per day. 270 capsule 3   fluticasone (FLONASE) 50 MCG/ACT nasal spray Place 1 spray into both nostrils as needed for allergies or rhinitis.     levonorgestrel (MIRENA) 20 MCG/24HR IUD 1 each by Intrauterine route once.     levothyroxine (SYNTHROID) 75 MCG tablet TAKE ONE TABLET BY MOUTH DAILY BEFORE BREAKFAST 90 tablet 3   magnesium 30 MG tablet Take 30 mg by mouth daily.     No current facility-administered medications for this visit.     ALLERGIES: Peanuts [peanut oil]  Family History  Problem Relation Age of Onset   Diabetes Mother    Hypertension Mother    Thyroid disease Mother        hx thyroid nodule   Hypertension Father    Hyperlipidemia Father    Stroke Father    Thyroid disease Sister    Diabetes Maternal Grandmother    Diabetes Maternal  Grandfather    Diabetes Paternal Grandmother    Diabetes Paternal Grandfather     Social History   Socioeconomic History   Marital status: Married    Spouse name: Romie Minus   Number of children: 2   Years of education: Not on file   Highest education level: Master's degree (e.g., MA, MS, MEng, MEd, MSW, MBA)  Occupational History   Not on file  Tobacco Use   Smoking status: Never   Smokeless tobacco: Never  Vaping Use   Vaping Use: Never used  Substance and Sexual Activity   Alcohol use: Yes    Alcohol/week: 1.0 standard drink    Types: 1 Cans of beer per week   Drug use: No   Sexual activity: Yes    Partners: Male    Birth control/protection: I.U.D.    Comment: [name] inserted 09-17-15  Other Topics Concern   Not on file  Social History Narrative   Patient is right-handed. She lives with her husband and 2 daughters. 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 She avoids caffeine. She goes to the gym for Pilates 2 days a week.   Social Determinants of Health   Financial Resource Strain: Not on file  Food Insecurity: Not on file  Transportation Needs: Not on file  Physical Activity: Not on file  Stress: Not on file  Social Connections: Not on file  Intimate Partner Violence: Not on file    Review of Systems  PHYSICAL EXAMINATION:    There were no vitals taken for this visit.    General appearance: alert, cooperative and appears stated age Head: Normocephalic, without obvious abnormality, atraumatic Neck: no adenopathy, supple, symmetrical, trachea midline and thyroid normal to inspection and palpation Lungs: clear to auscultation bilaterally Breasts: normal appearance, no masses or tenderness, No nipple retraction or dimpling, No nipple discharge or bleeding, No axillary or supraclavicular adenopathy Heart: regular rate and rhythm Abdomen: soft, non-tender, no masses,  no organomegaly Extremities: extremities normal, atraumatic, no cyanosis or edema Skin: Skin color, texture, turgor normal. No rashes or lesions Lymph nodes: Cervical, supraclavicular, and axillary nodes normal. No abnormal inguinal nodes palpated Neurologic: Grossly normal  Pelvic: External genitalia:  no lesions              Urethra:  normal appearing urethra with no masses, tenderness or lesions              Bartholins and Skenes: normal                 Vagina: normal appearing vagina with normal color and discharge, no lesions              Cervix: no lesions                Bimanual Exam:  Uterus:  normal size, contour, position, consistency, mobility, non-tender              Adnexa: no mass, fullness, tenderness              Rectal exam: {yes no:314532}.  Confirms.              Anus:  normal sphincter tone, no lesions  Chaperone was present for exam.  ASSESSMENT     PLAN     An After Visit Summary was  printed and given to the patient.  ______ minutes face to face time of  which over 50% was spent in counseling.

## 2020-12-31 ENCOUNTER — Ambulatory Visit: Payer: Managed Care, Other (non HMO) | Admitting: Obstetrics and Gynecology

## 2021-01-10 ENCOUNTER — Ambulatory Visit: Payer: Managed Care, Other (non HMO) | Admitting: Obstetrics and Gynecology

## 2021-02-18 NOTE — Progress Notes (Deleted)
50 y.o. G57P2002 Married [place] female here for annual exam.    PCP:     No LMP recorded. (Menstrual status: IUD).           Sexually active: {yes no:314532}  The current method of family planning is IUD--[name] 09/17/15.    Exercising: {yes no:314532}  {types:19826} Smoker:  no  Health Maintenance: Pap:  09/09/17 Neg:Neg HR HPV, 03-01-14 Neg:Neg HR HPV History of abnormal Pap:  no MMG: 02-06-20 Diag.Bil w/Lt.Br.US. Neg/BiRads2/Hx of fibroadenoma Lt.Br.slightly increased in size Colonoscopy:  *** BMD:   n/a  Result  n/a TDaP:  12-13-18 Gardasil:   no HIV: Neg in the past Hep C:No Screening Labs:  Hb today: ***, Urine today: ***   reports that she has never smoked. She has never used smokeless tobacco. She reports current alcohol use of about 1.0 standard drink of alcohol per week. She reports that she does not use drugs.  Past Medical History:  Diagnosis Date   Anxiety    Anxiety and depression 05/27/2018   Depression    Herniated cervical disc 2021   C5   Hyperlipidemia    Hypothyroidism    Infertility, female    Migraine headache with aura    Sciatica    Sleep disorder 05/27/2018   Thyroid disease     Past Surgical History:  Procedure Laterality Date   BREAST BIOPSY Left 2020   benign fibroadenoma   CESAREAN SECTION     2 times   hemorrhectomy  09/2007   --in [place]   LUMBAR LAMINECTOMY Right 10/01/2016   Procedure: RIGHT SIDEDE LUMBAR 5-SACRUM 1 MICRODISCECTOMY;  Surgeon: Phylliss Bob, MD;  Location: [facility];  Service: Orthopedics;  Laterality: Right;  RIGHT SIDEDE LUMBAR 5-SACRUM 1 MICRODISCECTOMY    WISDOM TOOTH EXTRACTION      Current Outpatient Medications  Medication Sig Dispense Refill   BIOTIN PO Take by mouth daily.     cholecalciferol (VITAMIN D) 1000 units tablet Take 1,000 Units by mouth daily.     FLUoxetine (PROZAC) 10 MG capsule Take 3 capsules (30 mg) orally per day. 270 capsule 3   fluticasone (FLONASE) 50 MCG/ACT nasal spray Place 1 spray  into both nostrils as needed for allergies or rhinitis.     levonorgestrel (MIRENA) 20 MCG/24HR IUD 1 each by Intrauterine route once.     levothyroxine (SYNTHROID) 75 MCG tablet TAKE ONE TABLET BY MOUTH DAILY BEFORE BREAKFAST 90 tablet 3   magnesium 30 MG tablet Take 30 mg by mouth daily.     No current facility-administered medications for this visit.    Family History  Problem Relation Age of Onset   Diabetes Mother    Hypertension Mother    Thyroid disease Mother        hx thyroid nodule   Hypertension Father    Hyperlipidemia Father    Stroke Father    Thyroid disease Sister    Diabetes Maternal Grandmother    Diabetes Maternal Grandfather    Diabetes Paternal Grandmother    Diabetes Paternal Grandfather     Review of Systems  Exam:   There were no vitals taken for this visit.    General appearance: alert, cooperative and appears stated age Head: normocephalic, without obvious abnormality, atraumatic Neck: no adenopathy, supple, symmetrical, trachea midline and thyroid normal to inspection and palpation Lungs: clear to auscultation bilaterally Breasts: normal appearance, no masses or tenderness, No nipple retraction or dimpling, No nipple discharge or bleeding, No axillary adenopathy Heart: regular rate  and rhythm Abdomen: soft, non-tender; no masses, no organomegaly Extremities: extremities normal, atraumatic, no cyanosis or edema Skin: skin color, texture, turgor normal. No rashes or lesions Lymph nodes: cervical, supraclavicular, and axillary nodes normal. Neurologic: grossly normal  Pelvic: External genitalia:  no lesions              No abnormal inguinal nodes palpated.              Urethra:  normal appearing urethra with no masses, tenderness or lesions              Bartholins and Skenes: normal                 Vagina: normal appearing vagina with normal color and discharge, no lesions              Cervix: no lesions              Pap taken: {yes  no:314532} Bimanual Exam:  Uterus:  normal size, contour, position, consistency, mobility, non-tender              Adnexa: no mass, fullness, tenderness              Rectal exam: {yes no:314532}.  Confirms.              Anus:  normal sphincter tone, no lesions  Chaperone was present for exam:  ***  Assessment:   Well woman visit with gynecologic exam.   Plan: Mammogram screening discussed. Self breast awareness reviewed. Pap and HR HPV as above. Guidelines for Calcium, Vitamin D, regular exercise program including cardiovascular and weight bearing exercise.   Follow up annually and prn.   Additional counseling given.  {yes [id number]. _______ minutes face to face time of which over 50% was spent in counseling.    After visit summary provided.

## 2021-02-21 ENCOUNTER — Ambulatory Visit: Payer: Managed Care, Other (non HMO) | Admitting: Obstetrics and Gynecology

## 2021-03-08 ENCOUNTER — Other Ambulatory Visit: Payer: Self-pay | Admitting: *Deleted

## 2021-03-27 NOTE — Telephone Encounter (Signed)
Rachel Wiggins, CMA sent to [facility] Triage I called patient she has moved to [place]. She stated that she will be looking for a new GYN doctor and that she no longer needs prescription.        Previous Messages   ----- Message -----  From: Ramond Craver, RMA  Sent: 03/26/2021  10:12 AM EDT  To: [facility] Appoint[facility]  Subject: needs AEX scheduled                             I received refill request for her on a prescription. She is overdue for AEX. Can you call her and get her scheduled so I can process this refill through her provider? Will you please let me know once she's scheduled. Thank SO much!

## 2022-05-28 ENCOUNTER — Encounter: Payer: Self-pay | Admitting: Internal Medicine
